# Patient Record
Sex: Female | Born: 1955 | Race: White | Hispanic: No | Smoking: Former smoker
Health system: Southern US, Community
[De-identification: ages and names within clinical notes are randomized; demographics above are authoritative.]

## PROBLEM LIST (undated history)

## (undated) ENCOUNTER — Emergency Department (HOSPITAL_COMMUNITY)

## (undated) DIAGNOSIS — H8109 Meniere's disease, unspecified ear: Secondary | ICD-10-CM

## (undated) DIAGNOSIS — I10 Essential (primary) hypertension: Secondary | ICD-10-CM

## (undated) DIAGNOSIS — J449 Chronic obstructive pulmonary disease, unspecified: Secondary | ICD-10-CM

## (undated) DIAGNOSIS — R079 Chest pain, unspecified: Secondary | ICD-10-CM

## (undated) DIAGNOSIS — M1990 Unspecified osteoarthritis, unspecified site: Secondary | ICD-10-CM

## (undated) DIAGNOSIS — I272 Pulmonary hypertension, unspecified: Secondary | ICD-10-CM

## (undated) DIAGNOSIS — Z87891 Personal history of nicotine dependence: Secondary | ICD-10-CM

## (undated) HISTORY — PX: ABDOMINAL HYSTERECTOMY: SHX81

## (undated) HISTORY — PX: TONSILLECTOMY: SUR1361

---
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
## 2010-09-18 ENCOUNTER — Encounter: Payer: Self-pay | Admitting: Internal Medicine

## 2014-05-31 ENCOUNTER — Encounter (HOSPITAL_COMMUNITY): Payer: Self-pay | Admitting: Emergency Medicine

## 2014-05-31 ENCOUNTER — Emergency Department (HOSPITAL_COMMUNITY)
Admission: EM | Admit: 2014-05-31 | Discharge: 2014-05-31 | Disposition: A | Discharge status: Home / Self Care | Payer: Self-pay | Attending: Emergency Medicine | Admitting: Emergency Medicine

## 2014-05-31 DIAGNOSIS — Z8739 Personal history of other diseases of the musculoskeletal system and connective tissue: Secondary | ICD-10-CM | POA: Insufficient documentation

## 2014-05-31 DIAGNOSIS — H6092 Unspecified otitis externa, left ear: Secondary | ICD-10-CM | POA: Insufficient documentation

## 2014-05-31 DIAGNOSIS — Z792 Long term (current) use of antibiotics: Secondary | ICD-10-CM | POA: Insufficient documentation

## 2014-05-31 DIAGNOSIS — Z880 Allergy status to penicillin: Secondary | ICD-10-CM | POA: Insufficient documentation

## 2014-05-31 DIAGNOSIS — Z720 Tobacco use: Secondary | ICD-10-CM | POA: Insufficient documentation

## 2014-05-31 DIAGNOSIS — J449 Chronic obstructive pulmonary disease, unspecified: Secondary | ICD-10-CM | POA: Insufficient documentation

## 2014-05-31 HISTORY — DX: Pulmonary hypertension, unspecified: I27.20

## 2014-05-31 HISTORY — DX: Unspecified osteoarthritis, unspecified site: M19.90

## 2014-05-31 HISTORY — DX: Chronic obstructive pulmonary disease, unspecified: J44.9

## 2014-05-31 HISTORY — DX: Meniere's disease, unspecified ear: H81.09

## 2014-05-31 MED ORDER — CIPROFLOXACIN-DEXAMETHASONE 0.3-0.1 % OT SUSP: 4.0000 [drp] | Freq: Two times a day (BID) | OTIC | Status: DC

## 2014-05-31 NOTE — ED Notes (Addendum)
She c/o L ear pain and drainage this week and states she can feel a knot under her ear.

## 2014-05-31 NOTE — ED Provider Notes (Signed)
CSN: [id number]     Arrival date & time 05/31/14  1213 History  This chart was scribed for non-physician practitioner, Michele Mcalpine, PA-C,working with Richarda Blade, MD, by Marlowe Kays, ED Scribe. This patient was seen in room TR05C/TR05C and the patient's care was started at 1:05 PM.  Chief Complaint  Patient presents with  . Otalgia   Patient is a 58 y.o. female presenting with ear pain. The history is provided by the patient. No language interpreter was used.  Otalgia Associated symptoms: ear discharge   Associated symptoms: no fever and no hearing loss    HPI Comments:  Amber Sexton is a 58 y.o. female with PMH of HTN, COPD and Meniere Disease who presents to the Emergency Department complaining of left ear pain and drainage for the past 3-4 days. She states she has an associated "knot" below her left ear as well. She states she normally wears a hearing aid which she has in place right now. She has not done anything for her symptoms. She denies fever, chills or increased hearing loss.  Past Medical History  Diagnosis Date  . Meniere disease   . COPD (chronic obstructive pulmonary disease)   . Arthritis   . Pulmonary hypertension    History reviewed. No pertinent past surgical history. History reviewed. No pertinent family history. History  Substance Use Topics  . Smoking status: Current Every Day Smoker    Types: Cigarettes  . Smokeless tobacco: Not on file  . Alcohol Use: No   OB History   Grav Para Term Preterm Abortions TAB SAB Ect Mult Living                 Review of Systems  Constitutional: Negative for fever and chills.  HENT: Positive for ear discharge and ear pain. Negative for hearing loss.   All other systems reviewed and are negative.   Allergies  Ampicillin  Home Medications   Prior to Admission medications   Medication Sig Start Date End Date Taking? Authorizing Provider  ciprofloxacin-dexamethasone (CIPRODEX) otic suspension Place 4 drops  into the left ear 2 (two) times daily. x5 days 05/31/14   Illene Labrador, PA-C   Triage Vitals: BP 124/107  Pulse 88  Temp(Src) 97.8 F (36.6 C) (Oral)  Resp 20  SpO2 98% Physical Exam  Nursing note and vitals reviewed. Constitutional: She is oriented to person, place, and time. She appears well-developed and well-nourished. No distress.  HENT:  Head: Normocephalic and atraumatic.  Left Ear: There is tenderness. No drainage. No decreased hearing is noted.  Mouth/Throat: Oropharynx is clear and moist.  Left ear canal inflamed and erythematous. No drainage. Small amount of cerumen noted.  Eyes: Conjunctivae and EOM are normal.  Neck: Normal range of motion. Neck supple.  Cardiovascular: Normal rate, regular rhythm and normal heart sounds.   Pulmonary/Chest: Effort normal and breath sounds normal. No respiratory distress.  Musculoskeletal: Normal range of motion. She exhibits no edema.  Neurological: She is alert and oriented to person, place, and time. No sensory deficit.  Skin: Skin is warm and dry.  Psychiatric: She has a normal mood and affect. Her behavior is normal.    ED Course  Procedures (including critical care time) DIAGNOSTIC STUDIES: Oxygen Saturation is 98% on RA, normal by my interpretation.   COORDINATION OF CARE: 1:09 PM- Will prescribe Ciprodex otic drops and irrigate left ear. Advised pt to leave hearing aid out until symptoms resolved. Pt verbalizes understanding and agrees to plan.  Medications -  No data to display  Labs Review Labs Reviewed - No data to display  Imaging Review No results found.   EKG Interpretation None      MDM   Final diagnoses:  Left otitis externa   Patient well-appearing in no apparent distress. Afebrile, hypertensive, vitals otherwise stable. Left otitis externa noted. Some cerumen is present, ear was irrigated. TM normal. Treat with Ciprodex ear drops. Advised her to not place her hearing aid until infection has subsided  and followup with PCP. Stable for discharge. Return precautions given. Patient states understanding of treatment care plan and is agreeable.  I personally performed the services described in this documentation, which was scribed in my presence. The recorded information has been reviewed and is accurate.    Illene Labrador, PA-C 05/31/14 1326

## 2014-05-31 NOTE — ED Provider Notes (Signed)
Medical screening examination/treatment/procedure(s) were performed by non-physician practitioner and as supervising physician I was immediately available for consultation/collaboration.  Richarda Blade, MD 05/31/14 [id number]

## 2014-05-31 NOTE — Discharge Instructions (Signed)
Apply antibiotic ear drops into your ear as directed. Follow up with your PCP. Do not use the hearing aid until infection has resolved.  Otitis Externa Otitis externa is a bacterial or fungal infection of the outer ear canal. This is the area from the eardrum to the outside of the ear. Otitis externa is sometimes called "swimmer's ear." CAUSES  Possible causes of infection include:  Swimming in dirty water.  Moisture remaining in the ear after swimming or bathing.  Mild injury (trauma) to the ear.  Objects stuck in the ear (foreign body).  Cuts or scrapes (abrasions) on the outside of the ear. SIGNS AND SYMPTOMS  The first symptom of infection is often itching in the ear canal. Later signs and symptoms may include swelling and redness of the ear canal, ear pain, and yellowish-white fluid (pus) coming from the ear. The ear pain may be worse when pulling on the earlobe. DIAGNOSIS  Your health care provider will perform a physical exam. A sample of fluid may be taken from the ear and examined for bacteria or fungi. TREATMENT  Antibiotic ear drops are often given for 10 to 14 days. Treatment may also include pain medicine or corticosteroids to reduce itching and swelling. HOME CARE INSTRUCTIONS   Apply antibiotic ear drops to the ear canal as prescribed by your health care provider.  Take medicines only as directed by your health care provider.  If you have diabetes, follow any additional treatment instructions from your health care provider.  Keep all follow-up visits as directed by your health care provider. PREVENTION   Keep your ear dry. Use the corner of a towel to absorb water out of the ear canal after swimming or bathing.  Avoid scratching or putting objects inside your ear. This can damage the ear canal or remove the protective wax that lines the canal. This makes it easier for bacteria and fungi to grow.  Avoid swimming in lakes, polluted water, or poorly chlorinated  pools.  You may use ear drops made of rubbing alcohol and vinegar after swimming. Combine equal parts of white vinegar and alcohol in a bottle. Put 3 or 4 drops into each ear after swimming. SEEK MEDICAL CARE IF:   You have a fever.  Your ear is still red, swollen, painful, or draining pus after 3 days.  Your redness, swelling, or pain gets worse.  You have a severe headache.  You have redness, swelling, pain, or tenderness in the area behind your ear. MAKE SURE YOU:   Understand these instructions.  Will watch your condition.  Will get help right away if you are not doing well or get worse. Document Released: 08/14/2005 Document Revised: 12/29/2013 Document Reviewed: 08/31/2011 [facility] Patient Information 2015 [place], [place]. This information is not intended to replace advice given to you by your health care provider. Make sure you discuss any questions you have with your health care provider.

## 2014-05-31 NOTE — ED Notes (Signed)
Declined W/C at D/C and was escorted to lobby by RN. 

## 2015-07-21 DIAGNOSIS — I10 Essential (primary) hypertension: Secondary | ICD-10-CM

## 2015-07-21 DIAGNOSIS — Z87891 Personal history of nicotine dependence: Secondary | ICD-10-CM | POA: Diagnosis not present

## 2015-07-21 DIAGNOSIS — R079 Chest pain, unspecified: Secondary | ICD-10-CM

## 2015-07-21 HISTORY — DX: Essential (primary) hypertension: I10

## 2015-07-21 HISTORY — DX: Chest pain, unspecified: R07.9

## 2015-07-21 HISTORY — DX: Personal history of nicotine dependence: Z87.891

## 2015-08-05 DIAGNOSIS — R079 Chest pain, unspecified: Secondary | ICD-10-CM | POA: Diagnosis not present

## 2015-08-05 DIAGNOSIS — Z87891 Personal history of nicotine dependence: Secondary | ICD-10-CM | POA: Diagnosis not present

## 2015-08-05 DIAGNOSIS — I10 Essential (primary) hypertension: Secondary | ICD-10-CM | POA: Diagnosis not present

## 2015-10-05 DIAGNOSIS — F3181 Bipolar II disorder: Secondary | ICD-10-CM | POA: Diagnosis not present

## 2015-10-05 DIAGNOSIS — F4310 Post-traumatic stress disorder, unspecified: Secondary | ICD-10-CM | POA: Diagnosis not present

## 2015-10-08 DIAGNOSIS — F4310 Post-traumatic stress disorder, unspecified: Secondary | ICD-10-CM | POA: Diagnosis not present

## 2015-10-08 DIAGNOSIS — F3181 Bipolar II disorder: Secondary | ICD-10-CM | POA: Diagnosis not present

## 2015-10-19 DIAGNOSIS — F3181 Bipolar II disorder: Secondary | ICD-10-CM | POA: Diagnosis not present

## 2015-10-19 DIAGNOSIS — F4310 Post-traumatic stress disorder, unspecified: Secondary | ICD-10-CM | POA: Diagnosis not present

## 2015-11-02 DIAGNOSIS — F4310 Post-traumatic stress disorder, unspecified: Secondary | ICD-10-CM | POA: Diagnosis not present

## 2015-11-02 DIAGNOSIS — F3181 Bipolar II disorder: Secondary | ICD-10-CM | POA: Diagnosis not present

## 2015-11-16 DIAGNOSIS — F4310 Post-traumatic stress disorder, unspecified: Secondary | ICD-10-CM | POA: Diagnosis not present

## 2015-11-16 DIAGNOSIS — F3181 Bipolar II disorder: Secondary | ICD-10-CM | POA: Diagnosis not present

## 2015-12-28 DIAGNOSIS — F4310 Post-traumatic stress disorder, unspecified: Secondary | ICD-10-CM | POA: Diagnosis not present

## 2015-12-28 DIAGNOSIS — F3181 Bipolar II disorder: Secondary | ICD-10-CM | POA: Diagnosis not present

## 2016-01-11 DIAGNOSIS — F4310 Post-traumatic stress disorder, unspecified: Secondary | ICD-10-CM | POA: Diagnosis not present

## 2016-01-11 DIAGNOSIS — F3181 Bipolar II disorder: Secondary | ICD-10-CM | POA: Diagnosis not present

## 2016-02-01 DIAGNOSIS — F3181 Bipolar II disorder: Secondary | ICD-10-CM | POA: Diagnosis not present

## 2016-02-01 DIAGNOSIS — F4310 Post-traumatic stress disorder, unspecified: Secondary | ICD-10-CM | POA: Diagnosis not present

## 2016-03-07 DIAGNOSIS — E669 Obesity, unspecified: Secondary | ICD-10-CM | POA: Diagnosis not present

## 2016-03-07 DIAGNOSIS — F329 Major depressive disorder, single episode, unspecified: Secondary | ICD-10-CM | POA: Diagnosis not present

## 2016-03-07 DIAGNOSIS — J449 Chronic obstructive pulmonary disease, unspecified: Secondary | ICD-10-CM | POA: Diagnosis not present

## 2016-03-07 DIAGNOSIS — H8109 Meniere's disease, unspecified ear: Secondary | ICD-10-CM | POA: Diagnosis not present

## 2016-03-07 DIAGNOSIS — Z6833 Body mass index (BMI) 33.0-33.9, adult: Secondary | ICD-10-CM | POA: Diagnosis not present

## 2016-03-20 DIAGNOSIS — H40023 Open angle with borderline findings, high risk, bilateral: Secondary | ICD-10-CM | POA: Diagnosis not present

## 2016-03-21 DIAGNOSIS — H40023 Open angle with borderline findings, high risk, bilateral: Secondary | ICD-10-CM | POA: Diagnosis not present

## 2016-04-05 DIAGNOSIS — Z6833 Body mass index (BMI) 33.0-33.9, adult: Secondary | ICD-10-CM | POA: Diagnosis not present

## 2016-04-05 DIAGNOSIS — E669 Obesity, unspecified: Secondary | ICD-10-CM | POA: Diagnosis not present

## 2016-04-05 DIAGNOSIS — F418 Other specified anxiety disorders: Secondary | ICD-10-CM | POA: Diagnosis not present

## 2016-04-17 DIAGNOSIS — F4310 Post-traumatic stress disorder, unspecified: Secondary | ICD-10-CM | POA: Diagnosis not present

## 2016-04-17 DIAGNOSIS — F3189 Other bipolar disorder: Secondary | ICD-10-CM | POA: Diagnosis not present

## 2016-04-26 DIAGNOSIS — M549 Dorsalgia, unspecified: Secondary | ICD-10-CM | POA: Diagnosis not present

## 2016-04-26 DIAGNOSIS — F418 Other specified anxiety disorders: Secondary | ICD-10-CM | POA: Diagnosis not present

## 2016-04-26 DIAGNOSIS — E669 Obesity, unspecified: Secondary | ICD-10-CM | POA: Diagnosis not present

## 2016-04-26 DIAGNOSIS — H8109 Meniere's disease, unspecified ear: Secondary | ICD-10-CM | POA: Diagnosis not present

## 2016-04-26 DIAGNOSIS — Z1389 Encounter for screening for other disorder: Secondary | ICD-10-CM | POA: Diagnosis not present

## 2016-04-26 DIAGNOSIS — F329 Major depressive disorder, single episode, unspecified: Secondary | ICD-10-CM | POA: Diagnosis not present

## 2016-04-26 DIAGNOSIS — Z0000 Encounter for general adult medical examination without abnormal findings: Secondary | ICD-10-CM | POA: Diagnosis not present

## 2016-05-03 DIAGNOSIS — H60312 Diffuse otitis externa, left ear: Secondary | ICD-10-CM | POA: Diagnosis not present

## 2016-05-03 DIAGNOSIS — H906 Mixed conductive and sensorineural hearing loss, bilateral: Secondary | ICD-10-CM | POA: Diagnosis not present

## 2016-05-03 DIAGNOSIS — H90A32 Mixed conductive and sensorineural hearing loss, unilateral, left ear with restricted hearing on the contralateral side: Secondary | ICD-10-CM | POA: Diagnosis not present

## 2016-05-10 DIAGNOSIS — H906 Mixed conductive and sensorineural hearing loss, bilateral: Secondary | ICD-10-CM | POA: Diagnosis not present

## 2016-05-10 DIAGNOSIS — H60312 Diffuse otitis externa, left ear: Secondary | ICD-10-CM | POA: Diagnosis not present

## 2016-05-16 DIAGNOSIS — J301 Allergic rhinitis due to pollen: Secondary | ICD-10-CM | POA: Diagnosis not present

## 2016-05-16 DIAGNOSIS — J449 Chronic obstructive pulmonary disease, unspecified: Secondary | ICD-10-CM | POA: Diagnosis not present

## 2016-05-16 DIAGNOSIS — R5383 Other fatigue: Secondary | ICD-10-CM | POA: Diagnosis not present

## 2016-05-16 DIAGNOSIS — E559 Vitamin D deficiency, unspecified: Secondary | ICD-10-CM | POA: Diagnosis not present

## 2016-05-16 DIAGNOSIS — G4733 Obstructive sleep apnea (adult) (pediatric): Secondary | ICD-10-CM | POA: Diagnosis not present

## 2016-05-22 DIAGNOSIS — F3181 Bipolar II disorder: Secondary | ICD-10-CM | POA: Diagnosis not present

## 2016-05-22 DIAGNOSIS — F4310 Post-traumatic stress disorder, unspecified: Secondary | ICD-10-CM | POA: Diagnosis not present

## 2016-06-01 DIAGNOSIS — G4733 Obstructive sleep apnea (adult) (pediatric): Secondary | ICD-10-CM | POA: Diagnosis not present

## 2016-06-06 DIAGNOSIS — H25813 Combined forms of age-related cataract, bilateral: Secondary | ICD-10-CM | POA: Diagnosis not present

## 2016-06-06 DIAGNOSIS — H353130 Nonexudative age-related macular degeneration, bilateral, stage unspecified: Secondary | ICD-10-CM | POA: Diagnosis not present

## 2016-06-06 DIAGNOSIS — H401131 Primary open-angle glaucoma, bilateral, mild stage: Secondary | ICD-10-CM | POA: Diagnosis not present

## 2016-06-12 DIAGNOSIS — J449 Chronic obstructive pulmonary disease, unspecified: Secondary | ICD-10-CM | POA: Diagnosis not present

## 2016-06-12 DIAGNOSIS — G4733 Obstructive sleep apnea (adult) (pediatric): Secondary | ICD-10-CM | POA: Diagnosis not present

## 2016-06-12 DIAGNOSIS — R5383 Other fatigue: Secondary | ICD-10-CM | POA: Diagnosis not present

## 2016-06-12 DIAGNOSIS — J301 Allergic rhinitis due to pollen: Secondary | ICD-10-CM | POA: Diagnosis not present

## 2016-06-14 DIAGNOSIS — G4733 Obstructive sleep apnea (adult) (pediatric): Secondary | ICD-10-CM | POA: Diagnosis not present

## 2016-06-19 DIAGNOSIS — H2512 Age-related nuclear cataract, left eye: Secondary | ICD-10-CM | POA: Diagnosis not present

## 2016-06-19 DIAGNOSIS — H25812 Combined forms of age-related cataract, left eye: Secondary | ICD-10-CM | POA: Diagnosis not present

## 2016-07-03 DIAGNOSIS — H25811 Combined forms of age-related cataract, right eye: Secondary | ICD-10-CM | POA: Diagnosis not present

## 2016-07-03 DIAGNOSIS — H2511 Age-related nuclear cataract, right eye: Secondary | ICD-10-CM | POA: Diagnosis not present

## 2016-07-05 DIAGNOSIS — J301 Allergic rhinitis due to pollen: Secondary | ICD-10-CM | POA: Diagnosis not present

## 2016-07-05 DIAGNOSIS — G4733 Obstructive sleep apnea (adult) (pediatric): Secondary | ICD-10-CM | POA: Diagnosis not present

## 2016-07-05 DIAGNOSIS — R5383 Other fatigue: Secondary | ICD-10-CM | POA: Diagnosis not present

## 2016-07-05 DIAGNOSIS — J449 Chronic obstructive pulmonary disease, unspecified: Secondary | ICD-10-CM | POA: Diagnosis not present

## 2016-07-15 DIAGNOSIS — R109 Unspecified abdominal pain: Secondary | ICD-10-CM | POA: Diagnosis not present

## 2016-07-15 DIAGNOSIS — R079 Chest pain, unspecified: Secondary | ICD-10-CM | POA: Diagnosis not present

## 2016-07-18 DIAGNOSIS — F4310 Post-traumatic stress disorder, unspecified: Secondary | ICD-10-CM | POA: Diagnosis not present

## 2016-07-18 DIAGNOSIS — F3181 Bipolar II disorder: Secondary | ICD-10-CM | POA: Diagnosis not present

## 2016-07-19 DIAGNOSIS — D124 Benign neoplasm of descending colon: Secondary | ICD-10-CM | POA: Diagnosis not present

## 2016-07-19 DIAGNOSIS — K5730 Diverticulosis of large intestine without perforation or abscess without bleeding: Secondary | ICD-10-CM | POA: Diagnosis not present

## 2016-07-19 DIAGNOSIS — D120 Benign neoplasm of cecum: Secondary | ICD-10-CM | POA: Diagnosis not present

## 2016-07-19 DIAGNOSIS — Z1211 Encounter for screening for malignant neoplasm of colon: Secondary | ICD-10-CM | POA: Diagnosis not present

## 2016-07-19 DIAGNOSIS — K635 Polyp of colon: Secondary | ICD-10-CM | POA: Diagnosis not present

## 2016-07-19 DIAGNOSIS — K640 First degree hemorrhoids: Secondary | ICD-10-CM | POA: Diagnosis not present

## 2016-08-15 DIAGNOSIS — J301 Allergic rhinitis due to pollen: Secondary | ICD-10-CM | POA: Diagnosis not present

## 2016-08-15 DIAGNOSIS — R5383 Other fatigue: Secondary | ICD-10-CM | POA: Diagnosis not present

## 2016-08-15 DIAGNOSIS — J449 Chronic obstructive pulmonary disease, unspecified: Secondary | ICD-10-CM | POA: Diagnosis not present

## 2016-08-15 DIAGNOSIS — G4733 Obstructive sleep apnea (adult) (pediatric): Secondary | ICD-10-CM | POA: Diagnosis not present

## 2016-08-16 DIAGNOSIS — G4733 Obstructive sleep apnea (adult) (pediatric): Secondary | ICD-10-CM | POA: Diagnosis not present

## 2016-09-11 DIAGNOSIS — R600 Localized edema: Secondary | ICD-10-CM | POA: Diagnosis not present

## 2016-09-11 DIAGNOSIS — F329 Major depressive disorder, single episode, unspecified: Secondary | ICD-10-CM | POA: Diagnosis not present

## 2016-09-11 DIAGNOSIS — E669 Obesity, unspecified: Secondary | ICD-10-CM | POA: Diagnosis not present

## 2016-09-11 DIAGNOSIS — Z6835 Body mass index (BMI) 35.0-35.9, adult: Secondary | ICD-10-CM | POA: Diagnosis not present

## 2016-09-19 DIAGNOSIS — F4310 Post-traumatic stress disorder, unspecified: Secondary | ICD-10-CM | POA: Diagnosis not present

## 2016-09-19 DIAGNOSIS — F3181 Bipolar II disorder: Secondary | ICD-10-CM | POA: Diagnosis not present

## 2016-10-10 DIAGNOSIS — F4310 Post-traumatic stress disorder, unspecified: Secondary | ICD-10-CM | POA: Diagnosis not present

## 2016-10-10 DIAGNOSIS — F3181 Bipolar II disorder: Secondary | ICD-10-CM | POA: Diagnosis not present

## 2016-10-24 DIAGNOSIS — E669 Obesity, unspecified: Secondary | ICD-10-CM | POA: Diagnosis not present

## 2016-10-24 DIAGNOSIS — Z6835 Body mass index (BMI) 35.0-35.9, adult: Secondary | ICD-10-CM | POA: Diagnosis not present

## 2016-10-24 DIAGNOSIS — F418 Other specified anxiety disorders: Secondary | ICD-10-CM | POA: Diagnosis not present

## 2016-10-31 DIAGNOSIS — H401132 Primary open-angle glaucoma, bilateral, moderate stage: Secondary | ICD-10-CM | POA: Diagnosis not present

## 2016-11-10 DIAGNOSIS — G4733 Obstructive sleep apnea (adult) (pediatric): Secondary | ICD-10-CM | POA: Diagnosis not present

## 2016-11-14 DIAGNOSIS — J301 Allergic rhinitis due to pollen: Secondary | ICD-10-CM | POA: Diagnosis not present

## 2016-11-14 DIAGNOSIS — J449 Chronic obstructive pulmonary disease, unspecified: Secondary | ICD-10-CM | POA: Diagnosis not present

## 2016-11-14 DIAGNOSIS — G4733 Obstructive sleep apnea (adult) (pediatric): Secondary | ICD-10-CM | POA: Diagnosis not present

## 2016-11-14 DIAGNOSIS — R5383 Other fatigue: Secondary | ICD-10-CM | POA: Diagnosis not present

## 2016-12-12 DIAGNOSIS — F3181 Bipolar II disorder: Secondary | ICD-10-CM | POA: Diagnosis not present

## 2016-12-12 DIAGNOSIS — F4310 Post-traumatic stress disorder, unspecified: Secondary | ICD-10-CM | POA: Diagnosis not present

## 2017-03-06 DIAGNOSIS — F4310 Post-traumatic stress disorder, unspecified: Secondary | ICD-10-CM | POA: Diagnosis not present

## 2017-03-06 DIAGNOSIS — F3181 Bipolar II disorder: Secondary | ICD-10-CM | POA: Diagnosis not present

## 2017-03-16 DIAGNOSIS — H401132 Primary open-angle glaucoma, bilateral, moderate stage: Secondary | ICD-10-CM | POA: Diagnosis not present

## 2017-03-26 DIAGNOSIS — R600 Localized edema: Secondary | ICD-10-CM | POA: Diagnosis not present

## 2017-03-26 DIAGNOSIS — F329 Major depressive disorder, single episode, unspecified: Secondary | ICD-10-CM | POA: Diagnosis not present

## 2017-03-26 DIAGNOSIS — F419 Anxiety disorder, unspecified: Secondary | ICD-10-CM | POA: Diagnosis not present

## 2017-03-26 DIAGNOSIS — G629 Polyneuropathy, unspecified: Secondary | ICD-10-CM | POA: Diagnosis not present

## 2017-03-26 DIAGNOSIS — Z6836 Body mass index (BMI) 36.0-36.9, adult: Secondary | ICD-10-CM | POA: Diagnosis not present

## 2017-03-26 DIAGNOSIS — M549 Dorsalgia, unspecified: Secondary | ICD-10-CM | POA: Diagnosis not present

## 2017-03-26 DIAGNOSIS — E669 Obesity, unspecified: Secondary | ICD-10-CM | POA: Diagnosis not present

## 2017-05-11 DIAGNOSIS — G4733 Obstructive sleep apnea (adult) (pediatric): Secondary | ICD-10-CM | POA: Diagnosis not present

## 2017-05-22 DIAGNOSIS — F4310 Post-traumatic stress disorder, unspecified: Secondary | ICD-10-CM | POA: Diagnosis not present

## 2017-06-29 DIAGNOSIS — E669 Obesity, unspecified: Secondary | ICD-10-CM | POA: Diagnosis not present

## 2017-06-29 DIAGNOSIS — Z23 Encounter for immunization: Secondary | ICD-10-CM | POA: Diagnosis not present

## 2017-06-29 DIAGNOSIS — F419 Anxiety disorder, unspecified: Secondary | ICD-10-CM | POA: Diagnosis not present

## 2017-06-29 DIAGNOSIS — J449 Chronic obstructive pulmonary disease, unspecified: Secondary | ICD-10-CM | POA: Diagnosis not present

## 2017-06-29 DIAGNOSIS — Z6835 Body mass index (BMI) 35.0-35.9, adult: Secondary | ICD-10-CM | POA: Diagnosis not present

## 2017-06-29 DIAGNOSIS — R600 Localized edema: Secondary | ICD-10-CM | POA: Diagnosis not present

## 2017-06-29 DIAGNOSIS — M549 Dorsalgia, unspecified: Secondary | ICD-10-CM | POA: Diagnosis not present

## 2017-06-29 DIAGNOSIS — Z1331 Encounter for screening for depression: Secondary | ICD-10-CM | POA: Diagnosis not present

## 2017-07-05 DIAGNOSIS — Z961 Presence of intraocular lens: Secondary | ICD-10-CM | POA: Diagnosis not present

## 2017-07-05 DIAGNOSIS — H401132 Primary open-angle glaucoma, bilateral, moderate stage: Secondary | ICD-10-CM | POA: Diagnosis not present

## 2017-08-13 DIAGNOSIS — Z6836 Body mass index (BMI) 36.0-36.9, adult: Secondary | ICD-10-CM | POA: Diagnosis not present

## 2017-08-13 DIAGNOSIS — K219 Gastro-esophageal reflux disease without esophagitis: Secondary | ICD-10-CM | POA: Diagnosis not present

## 2017-08-13 DIAGNOSIS — Z0000 Encounter for general adult medical examination without abnormal findings: Secondary | ICD-10-CM | POA: Diagnosis not present

## 2017-08-13 DIAGNOSIS — E669 Obesity, unspecified: Secondary | ICD-10-CM | POA: Diagnosis not present

## 2017-08-16 DIAGNOSIS — F4310 Post-traumatic stress disorder, unspecified: Secondary | ICD-10-CM | POA: Diagnosis not present

## 2017-09-18 DIAGNOSIS — F4310 Post-traumatic stress disorder, unspecified: Secondary | ICD-10-CM | POA: Diagnosis not present

## 2017-10-01 DIAGNOSIS — F419 Anxiety disorder, unspecified: Secondary | ICD-10-CM | POA: Diagnosis not present

## 2017-10-01 DIAGNOSIS — J449 Chronic obstructive pulmonary disease, unspecified: Secondary | ICD-10-CM | POA: Diagnosis not present

## 2017-10-01 DIAGNOSIS — R600 Localized edema: Secondary | ICD-10-CM | POA: Diagnosis not present

## 2017-10-01 DIAGNOSIS — J441 Chronic obstructive pulmonary disease with (acute) exacerbation: Secondary | ICD-10-CM | POA: Diagnosis not present

## 2017-10-01 DIAGNOSIS — M549 Dorsalgia, unspecified: Secondary | ICD-10-CM | POA: Diagnosis not present

## 2017-10-01 DIAGNOSIS — E669 Obesity, unspecified: Secondary | ICD-10-CM | POA: Diagnosis not present

## 2017-10-01 DIAGNOSIS — Z6837 Body mass index (BMI) 37.0-37.9, adult: Secondary | ICD-10-CM | POA: Diagnosis not present

## 2017-10-09 DIAGNOSIS — M79671 Pain in right foot: Secondary | ICD-10-CM | POA: Diagnosis not present

## 2017-10-09 DIAGNOSIS — Z6836 Body mass index (BMI) 36.0-36.9, adult: Secondary | ICD-10-CM | POA: Diagnosis not present

## 2017-10-09 DIAGNOSIS — J441 Chronic obstructive pulmonary disease with (acute) exacerbation: Secondary | ICD-10-CM | POA: Diagnosis not present

## 2017-10-11 ENCOUNTER — Other Ambulatory Visit: Payer: Self-pay | Admitting: Physician Assistant

## 2017-10-11 ENCOUNTER — Ambulatory Visit
Admission: RE | Admit: 2017-10-11 | Discharge: 2017-10-11 | Disposition: A | Discharge status: Home / Self Care | Payer: Medicare Other | Source: Ambulatory Visit | Attending: Physician Assistant | Admitting: Physician Assistant

## 2017-10-11 DIAGNOSIS — M79672 Pain in left foot: Secondary | ICD-10-CM

## 2017-10-11 DIAGNOSIS — S99922A Unspecified injury of left foot, initial encounter: Secondary | ICD-10-CM | POA: Diagnosis not present

## 2017-10-11 DIAGNOSIS — M7989 Other specified soft tissue disorders: Secondary | ICD-10-CM | POA: Diagnosis not present

## 2017-11-12 DIAGNOSIS — H401132 Primary open-angle glaucoma, bilateral, moderate stage: Secondary | ICD-10-CM | POA: Diagnosis not present

## 2017-11-16 DIAGNOSIS — F330 Major depressive disorder, recurrent, mild: Secondary | ICD-10-CM | POA: Diagnosis not present

## 2017-11-16 DIAGNOSIS — F4310 Post-traumatic stress disorder, unspecified: Secondary | ICD-10-CM | POA: Diagnosis not present

## 2017-12-31 DIAGNOSIS — F419 Anxiety disorder, unspecified: Secondary | ICD-10-CM | POA: Diagnosis not present

## 2017-12-31 DIAGNOSIS — J449 Chronic obstructive pulmonary disease, unspecified: Secondary | ICD-10-CM | POA: Diagnosis not present

## 2017-12-31 DIAGNOSIS — R600 Localized edema: Secondary | ICD-10-CM | POA: Diagnosis not present

## 2017-12-31 DIAGNOSIS — Z6837 Body mass index (BMI) 37.0-37.9, adult: Secondary | ICD-10-CM | POA: Diagnosis not present

## 2017-12-31 DIAGNOSIS — I10 Essential (primary) hypertension: Secondary | ICD-10-CM | POA: Diagnosis not present

## 2017-12-31 DIAGNOSIS — K219 Gastro-esophageal reflux disease without esophagitis: Secondary | ICD-10-CM | POA: Diagnosis not present

## 2017-12-31 DIAGNOSIS — Z136 Encounter for screening for cardiovascular disorders: Secondary | ICD-10-CM | POA: Diagnosis not present

## 2017-12-31 DIAGNOSIS — M25561 Pain in right knee: Secondary | ICD-10-CM | POA: Diagnosis not present

## 2017-12-31 DIAGNOSIS — R69 Illness, unspecified: Secondary | ICD-10-CM | POA: Diagnosis not present

## 2017-12-31 DIAGNOSIS — G629 Polyneuropathy, unspecified: Secondary | ICD-10-CM | POA: Diagnosis not present

## 2018-01-16 DIAGNOSIS — F4310 Post-traumatic stress disorder, unspecified: Secondary | ICD-10-CM | POA: Diagnosis not present

## 2018-01-16 DIAGNOSIS — R69 Illness, unspecified: Secondary | ICD-10-CM | POA: Diagnosis not present

## 2018-01-16 DIAGNOSIS — F330 Major depressive disorder, recurrent, mild: Secondary | ICD-10-CM | POA: Diagnosis not present

## 2018-02-01 DIAGNOSIS — H40013 Open angle with borderline findings, low risk, bilateral: Secondary | ICD-10-CM | POA: Diagnosis not present

## 2018-02-04 DIAGNOSIS — M25561 Pain in right knee: Secondary | ICD-10-CM | POA: Diagnosis not present

## 2018-02-04 DIAGNOSIS — M1711 Unilateral primary osteoarthritis, right knee: Secondary | ICD-10-CM | POA: Diagnosis not present

## 2018-03-11 DIAGNOSIS — H40013 Open angle with borderline findings, low risk, bilateral: Secondary | ICD-10-CM | POA: Diagnosis not present

## 2018-04-08 DIAGNOSIS — H401131 Primary open-angle glaucoma, bilateral, mild stage: Secondary | ICD-10-CM | POA: Diagnosis not present

## 2018-07-09 DIAGNOSIS — H353132 Nonexudative age-related macular degeneration, bilateral, intermediate dry stage: Secondary | ICD-10-CM | POA: Diagnosis not present

## 2018-07-09 DIAGNOSIS — Z961 Presence of intraocular lens: Secondary | ICD-10-CM | POA: Diagnosis not present

## 2018-07-09 DIAGNOSIS — H401132 Primary open-angle glaucoma, bilateral, moderate stage: Secondary | ICD-10-CM | POA: Diagnosis not present

## 2018-11-11 DIAGNOSIS — N959 Unspecified menopausal and perimenopausal disorder: Secondary | ICD-10-CM | POA: Diagnosis not present

## 2018-11-11 DIAGNOSIS — Z9181 History of falling: Secondary | ICD-10-CM | POA: Diagnosis not present

## 2018-11-11 DIAGNOSIS — Z0000 Encounter for general adult medical examination without abnormal findings: Secondary | ICD-10-CM | POA: Diagnosis not present

## 2018-11-11 DIAGNOSIS — Z1339 Encounter for screening examination for other mental health and behavioral disorders: Secondary | ICD-10-CM | POA: Diagnosis not present

## 2018-11-11 DIAGNOSIS — Z1331 Encounter for screening for depression: Secondary | ICD-10-CM | POA: Diagnosis not present

## 2018-11-11 DIAGNOSIS — Z6841 Body Mass Index (BMI) 40.0 and over, adult: Secondary | ICD-10-CM | POA: Diagnosis not present

## 2018-12-16 IMAGING — DX DG FOOT COMPLETE 3+V*L*
3 series · 3 of 3 positions shown · non-contrast
Comparison: None.

CLINICAL DATA: Swelling of the left metatarsals from a fall 1 week
ago.

EXAM:
LEFT FOOT - COMPLETE 3+ VIEW

[dg foot complete left (1 of 3)]
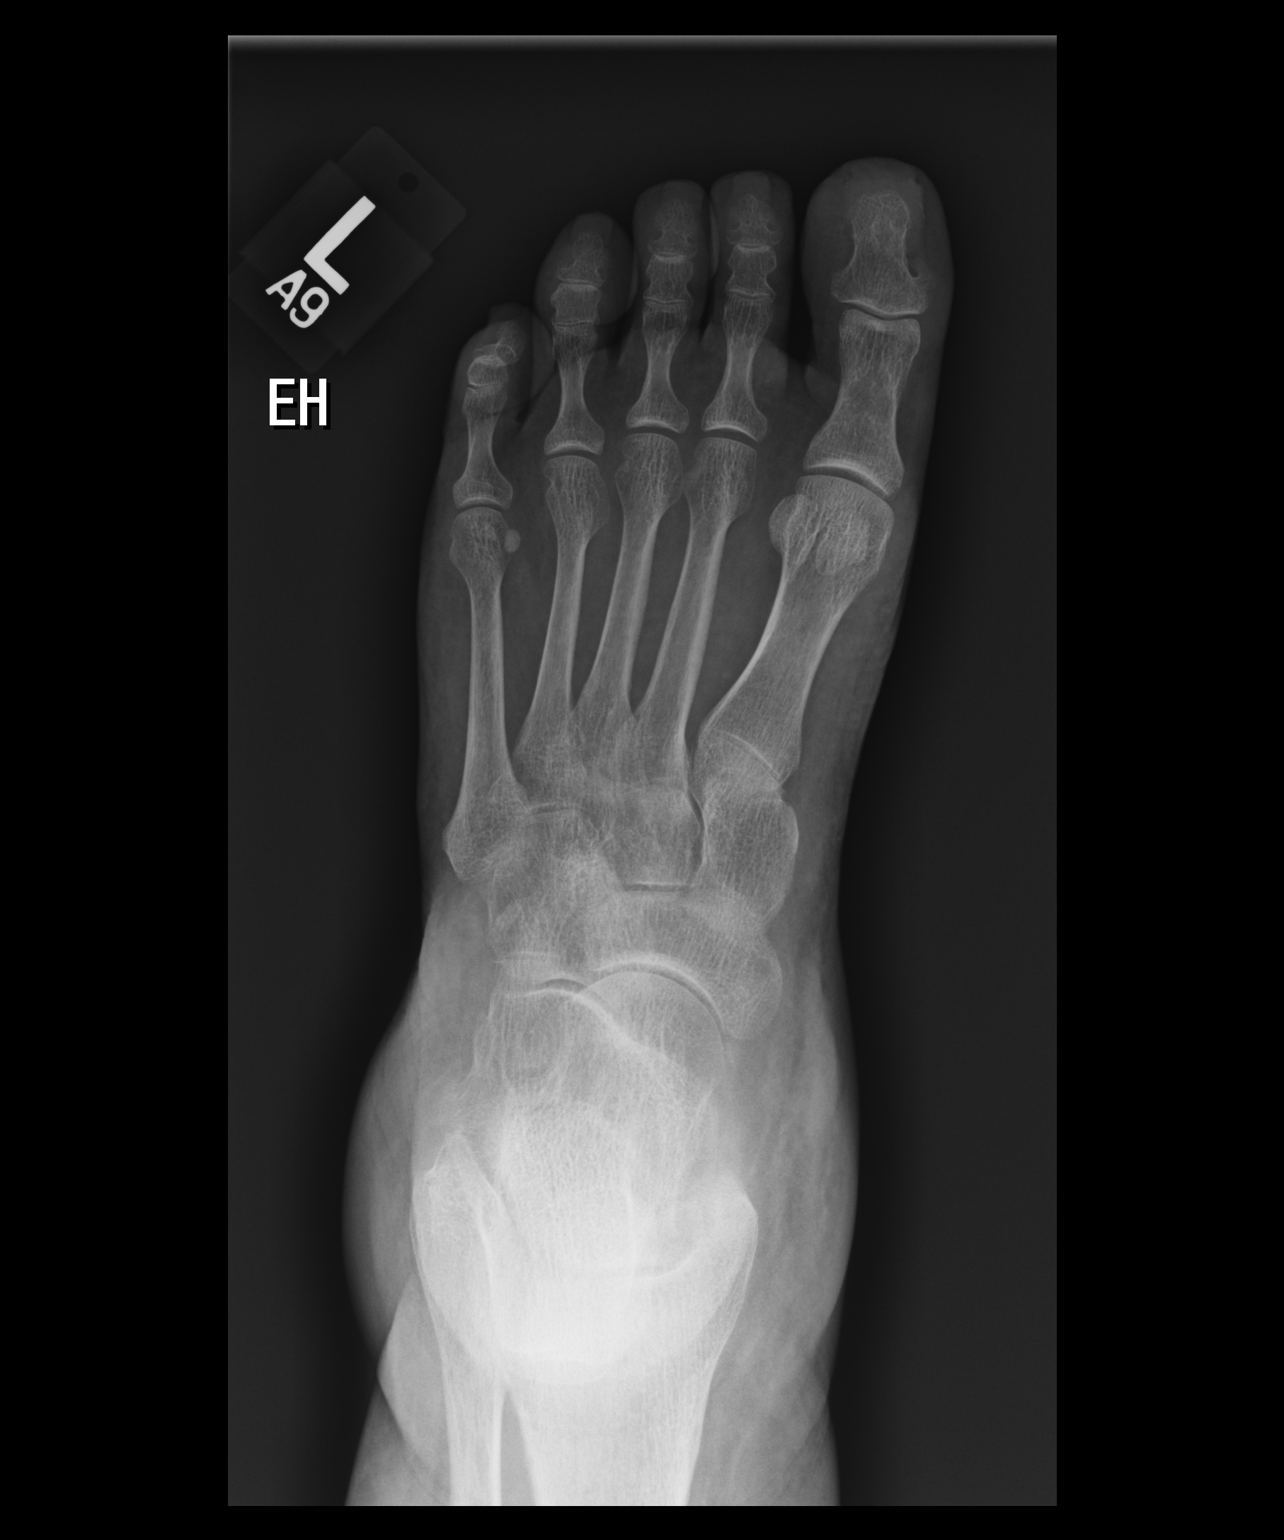

[dg foot complete left (2 of 3)]
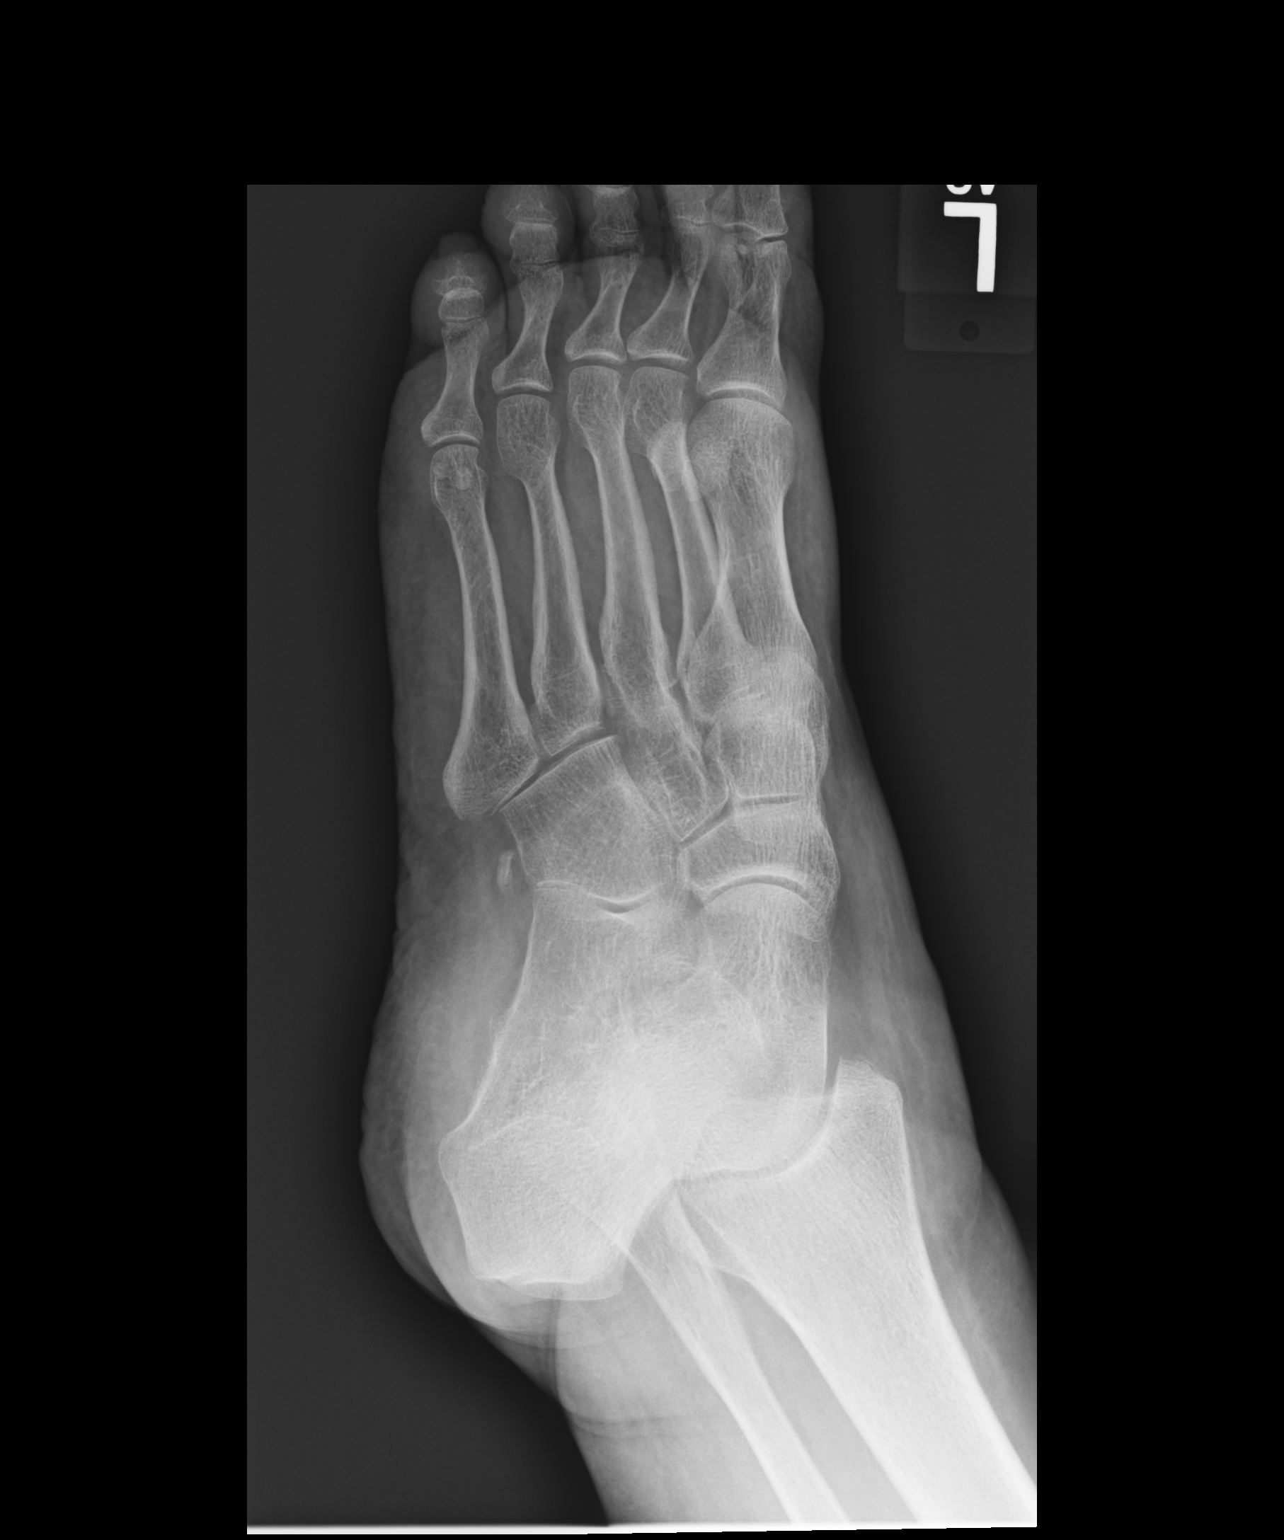

[dg foot complete left (3 of 3)]
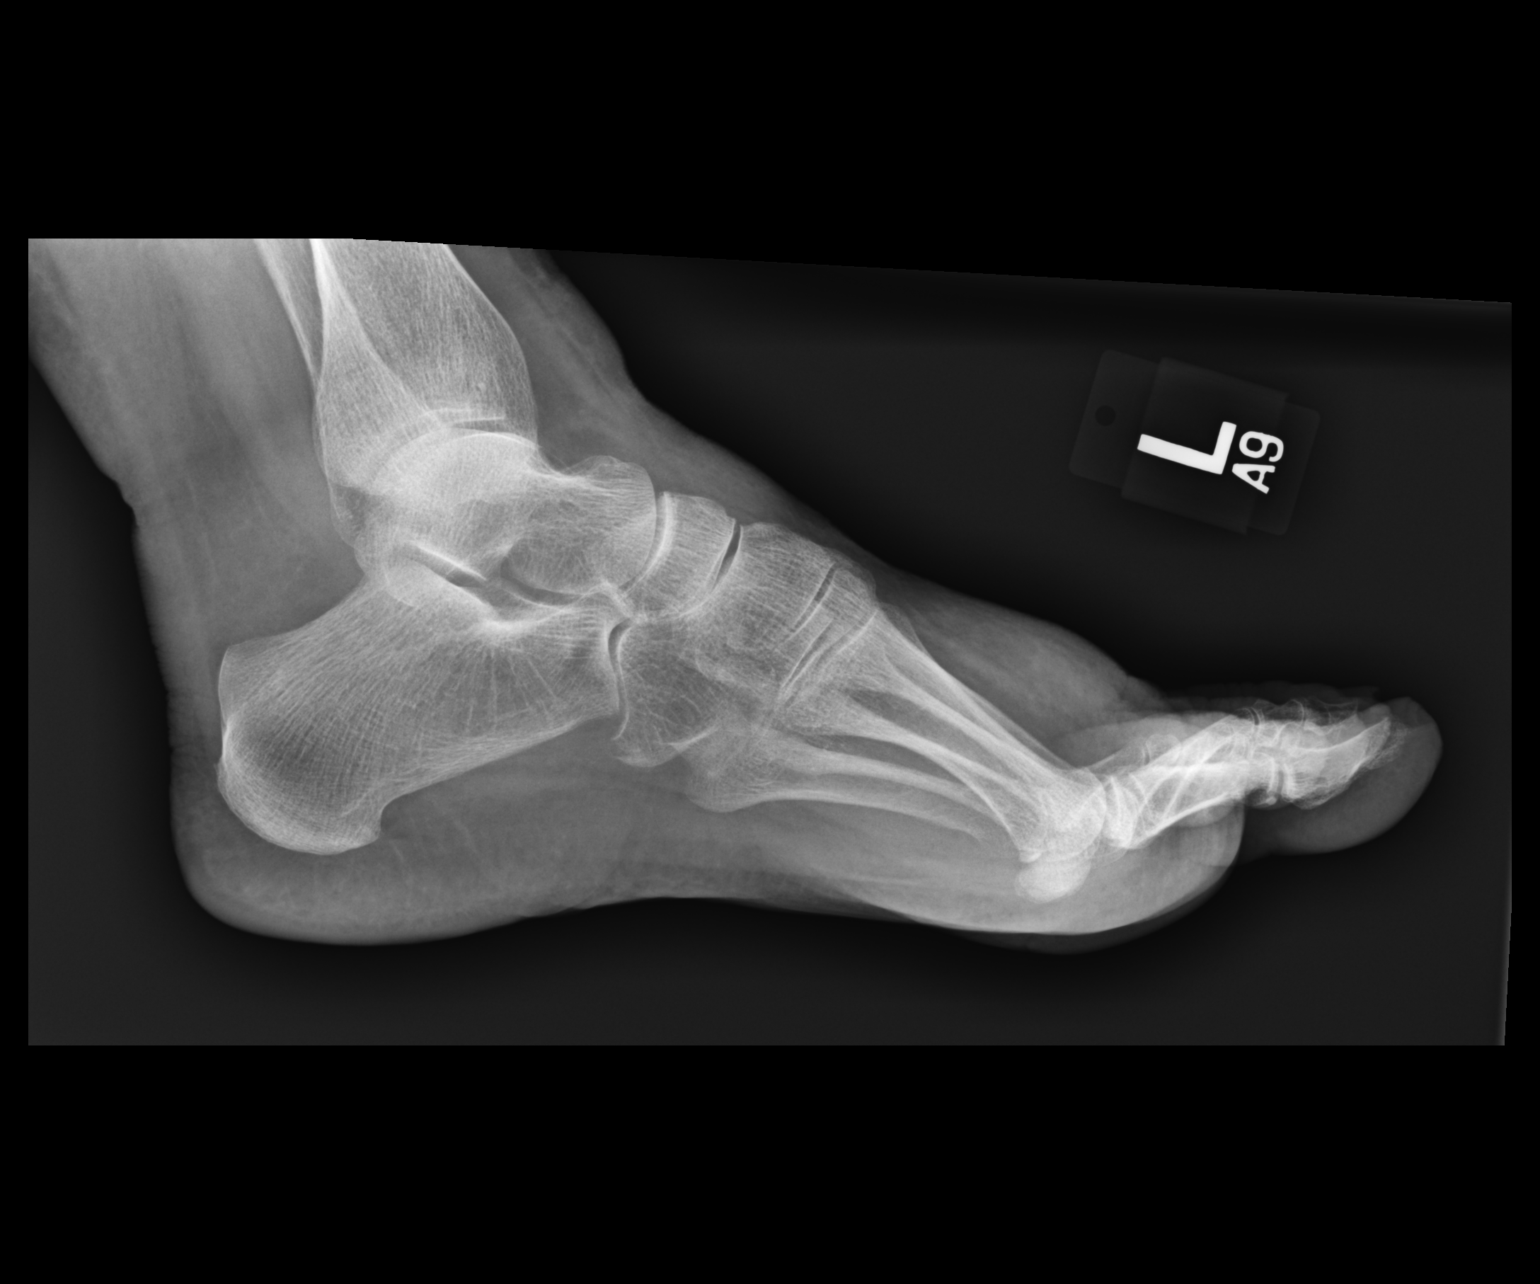

[3 of 3 positions shown; findings below may reference images not displayed]

FINDINGS: There is no evidence of fracture or dislocation. There is no
evidence of arthropathy or other focal bone abnormality. Dorsal soft
tissue swelling.
IMPRESSION: No acute fracture or dislocation identified about the left foot.

## 2019-01-06 DIAGNOSIS — H401132 Primary open-angle glaucoma, bilateral, moderate stage: Secondary | ICD-10-CM | POA: Diagnosis not present

## 2019-04-07 DIAGNOSIS — G629 Polyneuropathy, unspecified: Secondary | ICD-10-CM | POA: Diagnosis not present

## 2019-04-07 DIAGNOSIS — R600 Localized edema: Secondary | ICD-10-CM | POA: Diagnosis not present

## 2019-04-07 DIAGNOSIS — Z6841 Body Mass Index (BMI) 40.0 and over, adult: Secondary | ICD-10-CM | POA: Diagnosis not present

## 2019-04-07 DIAGNOSIS — I10 Essential (primary) hypertension: Secondary | ICD-10-CM | POA: Diagnosis not present

## 2019-04-07 DIAGNOSIS — J449 Chronic obstructive pulmonary disease, unspecified: Secondary | ICD-10-CM | POA: Diagnosis not present

## 2019-04-22 ENCOUNTER — Telehealth: Payer: Self-pay

## 2019-04-22 NOTE — Telephone Encounter (Signed)
Records request from Dr. Chauncy Passy office

## 2019-04-28 ENCOUNTER — Encounter: Payer: Self-pay | Admitting: *Deleted

## 2019-04-28 ENCOUNTER — Other Ambulatory Visit: Payer: Self-pay

## 2019-04-28 ENCOUNTER — Ambulatory Visit (INDEPENDENT_AMBULATORY_CARE_PROVIDER_SITE_OTHER): Payer: Medicare HMO | Admitting: Cardiology

## 2019-04-28 VITALS — BP 144/100 | HR 75 | Ht 65.0 in | Wt 232.0 lb

## 2019-04-28 DIAGNOSIS — R0609 Other forms of dyspnea: Secondary | ICD-10-CM

## 2019-04-28 DIAGNOSIS — I10 Essential (primary) hypertension: Secondary | ICD-10-CM | POA: Diagnosis not present

## 2019-04-28 DIAGNOSIS — Z87891 Personal history of nicotine dependence: Secondary | ICD-10-CM

## 2019-04-28 DIAGNOSIS — R079 Chest pain, unspecified: Secondary | ICD-10-CM

## 2019-04-28 DIAGNOSIS — R0600 Dyspnea, unspecified: Secondary | ICD-10-CM

## 2019-04-28 DIAGNOSIS — G4730 Sleep apnea, unspecified: Secondary | ICD-10-CM

## 2019-04-28 HISTORY — DX: Other forms of dyspnea: R06.09

## 2019-04-28 HISTORY — DX: Dyspnea, unspecified: R06.00

## 2019-04-28 HISTORY — DX: Sleep apnea, unspecified: G47.30

## 2019-04-28 NOTE — Patient Instructions (Signed)
Medication Instructions:   If you need a refill on your cardiac medications before your next appointment, please call your pharmacy.   Lab work:  If you have labs (blood work) drawn today and your tests are completely normal, you will receive your results only by:  [place] (if you have MyChart) OR  A paper copy in the mail If you have any lab test that is abnormal or we need to change your treatment, we will call you to review the results.  Testing/Procedures: You had an EKG performed today.  Your physician has requested that you have an echocardiogram. Echocardiography is a painless test that uses sound waves to create images of your heart. It provides your doctor with information about the size and shape of your heart and how well your hearts chambers and valves are working. This procedure takes approximately one hour. There are no restrictions for this procedure.  Your physician has requested that you have a lexiscan myoview. For further information please visit [email or web address]. Please follow instruction sheet, as given.    Follow-Up: At [facility], you and your health needs are our priority.  As part of our continuing mission to provide you with exceptional heart care, we have created designated Provider Care Teams.  These Care Teams include your primary Cardiologist (physician) and Advanced Practice Providers (APPs -  Physician Assistants and Nurse Practitioners) who all work together to provide you with the care you need, when you need it. You will need a follow up appointment in 6 months.   Any Other Special Instructions Will Be Listed Below  Regadenoson injection What is this medicine? REGADENOSON is used to test the heart for coronary artery disease. It is used in patients who can not exercise for their stress test. This medicine may be used for other purposes; ask your health care provider or pharmacist if you have questions. COMMON BRAND NAME(S):  Lexiscan What should I tell my health care provider before I take this medicine? They need to know if you have any of these conditions:  heart problems  lung or breathing disease, like asthma or COPD  an unusual or allergic reaction to regadenoson, other medicines, foods, dyes, or preservatives  pregnant or trying to get pregnant  breast-feeding How should I use this medicine? This medicine is for injection into a vein. It is given by a health care professional in a hospital or clinic setting. Talk to your pediatrician regarding the use of this medicine in children. Special care may be needed. Overdosage: If you think you have taken too much of this medicine contact a poison control center or emergency room at once. NOTE: This medicine is only for you. Do not share this medicine with others. What if I miss a dose? This does not apply. What may interact with this medicine?  caffeine  dipyridamole  guarana  theophylline This list may not describe all possible interactions. Give your health care provider a list of all the medicines, herbs, non-prescription drugs, or dietary supplements you use. Also tell them if you smoke, drink alcohol, or use illegal drugs. Some items may interact with your medicine. What should I watch for while using this medicine? Your condition will be monitored carefully while you are receiving this medicine. Do not take medicines, foods, or drinks with caffeine (like coffee, tea, or colas) for at least 12 hours before your test. If you do not know if something contains caffeine, ask your health care professional. What side effects may I notice  from receiving this medicine? Side effects that you should report to your doctor or health care professional as soon as possible:  allergic reactions like skin rash, itching or hives, swelling of the face, lips, or tongue  breathing problems  chest pain, tightness or palpitations  severe headache Side effects  that usually do not require medical attention (report to your doctor or health care professional if they continue or are bothersome):  flushing  headache  irritation or pain at site where injected  nausea, vomiting This list may not describe all possible side effects. Call your doctor for medical advice about side effects. You may report side effects to FDA at 1-800-FDA-1088. Where should I keep my medicine? This drug is given in a hospital or clinic and will not be stored at home. NOTE: This sheet is a summary. It may not cover all possible information. If you have questions about this medicine, talk to your doctor, pharmacist, or health care provider.  2020 Elsevier/Gold Standard (2008-04-13 15:08:13)  Cardiac Nuclear Scan A cardiac nuclear scan is a test that is done to check the flow of blood to your heart. It is done when you are resting and when you are exercising. The test looks for problems such as:  Not enough blood reaching a portion of the heart.  The heart muscle not working as it should. You may need this test if:  You have heart disease.  You have had lab results that are not normal.  You have had heart surgery or a balloon procedure to open up blocked arteries (angioplasty).  You have chest pain.  You have shortness of breath. In this test, a special dye (tracer) is put into your bloodstream. The tracer will travel to your heart. A camera will then take pictures of your heart to see how the tracer moves through your heart. This test is usually done at a hospital and takes 2-4 hours. Tell a doctor about:  Any allergies you have.  All medicines you are taking, including vitamins, herbs, eye drops, creams, and over-the-counter medicines.  Any problems you or family members have had with anesthetic medicines.  Any blood disorders you have.  Any surgeries you have had.  Any medical conditions you have.  Whether you are pregnant or may be pregnant. What are  the risks? Generally, this is a safe test. However, problems may occur, such as:  Serious chest pain and heart attack. This is only a risk if the stress portion of the test is done.  Rapid heartbeat.  A feeling of warmth in your chest. This feeling usually does not last long.  Allergic reaction to the tracer. What happens before the test?  Ask your doctor about changing or stopping your normal medicines. This is important.  Follow instructions from your doctor about what you cannot eat or drink.  Remove your jewelry on the day of the test. What happens during the test?  An IV tube will be inserted into one of your veins.  Your doctor will give you a small amount of tracer through the IV tube.  You will wait for 20-40 minutes while the tracer moves through your bloodstream.  Your heart will be monitored with an electrocardiogram (ECG).  You will lie down on an exam table.  Pictures of your heart will be taken for about 15-20 minutes.  You may also have a stress test. For this test, one of these things may be done: ? You will be asked to exercise on  a treadmill or a stationary bike. ? You will be given medicines that will make your heart work harder. This is done if you are unable to exercise.  When blood flow to your heart has peaked, a tracer will again be given through the IV tube.  After 20-40 minutes, you will get back on the exam table. More pictures will be taken of your heart.  Depending on the tracer that is used, more pictures may need to be taken 3-4 hours later.  Your IV tube will be removed when the test is over. The test may vary among doctors and hospitals. What happens after the test?  Ask your doctor: ? Whether you can return to your normal schedule, including diet, activities, and medicines. ? Whether you should drink more fluids. This will help to remove the tracer from your body. Drink enough fluid to keep your pee (urine) pale yellow.  Ask your  doctor, or the department that is doing the test: ? When will my results be ready? ? How will I get my results? Summary  A cardiac nuclear scan is a test that is done to check the flow of blood to your heart.  Tell your doctor whether you are pregnant or may be pregnant.  Before the test, ask your doctor about changing or stopping your normal medicines. This is important.  Ask your doctor whether you can return to your normal activities. You may be asked to drink more fluids. This information is not intended to replace advice given to you by your health care provider. Make sure you discuss any questions you have with your health care provider. Document Released: 01/28/2018 Document Revised: 12/04/2018 Document Reviewed: 01/28/2018 Elsevier Patient Education  [place].  Echocardiogram An echocardiogram is a procedure that uses painless sound waves (ultrasound) to produce an image of the heart. Images from an echocardiogram can provide important information about:  Signs of coronary artery disease (CAD).  Aneurysm detection. An aneurysm is a weak or damaged part of an artery wall that bulges out from the normal force of blood pumping through the body.  Heart size and shape. Changes in the size or shape of the heart can be associated with certain conditions, including heart failure, aneurysm, and CAD.  Heart muscle function.  Heart valve function.  Signs of a past heart attack.  Fluid buildup around the heart.  Thickening of the heart muscle.  A tumor or infectious growth around the heart valves. Tell a health care provider about:  Any allergies you have.  All medicines you are taking, including vitamins, herbs, eye drops, creams, and over-the-counter medicines.  Any blood disorders you have.  Any surgeries you have had.  Any medical conditions you have.  Whether you are pregnant or may be pregnant. What are the risks? Generally, this is a safe procedure.  However, problems may occur, including:  Allergic reaction to dye (contrast) that may be used during the procedure. What happens before the procedure? No specific preparation is needed. You may eat and drink normally. What happens during the procedure?   An IV tube may be inserted into one of your veins.  You may receive contrast through this tube. A contrast is an injection that improves the quality of the pictures from your heart.  A gel will be applied to your chest.  A wand-like tool (transducer) will be moved over your chest. The gel will help to transmit the sound waves from the transducer.  The sound waves will  harmlessly bounce off of your heart to allow the heart images to be captured in real-time motion. The images will be recorded on a computer. The procedure may vary among health care providers and hospitals. What happens after the procedure?  You may return to your normal, everyday life, including diet, activities, and medicines, unless your health care provider tells you not to do that. Summary  An echocardiogram is a procedure that uses painless sound waves (ultrasound) to produce an image of the heart.  Images from an echocardiogram can provide important information about the size and shape of your heart, heart muscle function, heart valve function, and fluid buildup around your heart.  You do not need to do anything to prepare before this procedure. You may eat and drink normally.  After the echocardiogram is completed, you may return to your normal, everyday life, unless your health care provider tells you not to do that. This information is not intended to replace advice given to you by your health care provider. Make sure you discuss any questions you have with your health care provider. Document Released: 08/11/2000 Document Revised: 12/05/2018 Document Reviewed: 09/16/2016 Elsevier Patient Education  2020 [employer].

## 2019-04-28 NOTE — Progress Notes (Signed)
Cardiology Office Note:    Date:  04/28/2019   ID:  Amber Sexton, DOB 09/04/1955, MRN [id number]  PCP:  [facility] [place] [facility]  Cardiologist:  Jenean Lindau, MD   Referring MD: Philmore Pali, NP    ASSESSMENT:    1. Essential hypertension   2. Ex-cigarette smoker   3. Chest pain, unspecified type   4. Dyspnea on exertion   5. Sleep apnea, unspecified type    PLAN:    In order of problems listed above:  1. Chest discomfort: The patient's symptoms are atypical for coronary etiology however in view of risk factors she will undergo Lexiscan sestamibi. 2. Essential hypertension: Blood pressure stable 3. History of pulmonary hypertension: Echocardiogram will be done to assess this. 4. Sleep apnea: Sleep health issues were discussed with the patient.  She will follow-up with this with her primary care physician and sleep specialist 5. Patient will be seen in follow-up appointment in 6 months or earlier if the patient has any concerns.  She knows to go to the nearest emergency room for any concerning symptoms.   Medication Adjustments/Labs and Tests Ordered: Current medicines are reviewed at length with the patient today.  Concerns regarding medicines are outlined above.  No orders of the defined types were placed in this encounter.  No orders of the defined types were placed in this encounter.    History of Present Illness:    Amber Sexton is a 63 y.o. female who is being seen today for the evaluation of dyspnea on exertion at the request of Lam, Rudi Rummage, NP.  Patient is a pleasant 63 year old female.  She has past medical history of pulmonary hypertension, essential hypertension.  She occasionally has chest discomfort and so she is referred here.  This is not related to exertion.  No radiation to the neck or to the arms.  It is stabbing-like nature at times.  At the time of my evaluation, the patient is alert awake oriented and in no distress.  Her daughter  accompanies her for this visit.  Past Medical History:  Diagnosis Date  . Arthritis   . Chest pain 07/21/2015  . COPD (chronic obstructive pulmonary disease) ([place])   . Essential hypertension 07/21/2015  . Ex-cigarette smoker 07/21/2015  . Meniere disease   . Pulmonary hypertension ([place])     Past Surgical History:  Procedure Laterality Date  . ABDOMINAL HYSTERECTOMY    . TONSILLECTOMY      Current Medications: Current Meds  Medication Sig  . albuterol (PROAIR HFA) 108 (90 Base) MCG/ACT inhaler INHALE 2 PUFFS BY MOUTH EVERY 4 TO 6 HOURS AS NEEDED     Allergies:   Ampicillin   Social History   Socioeconomic History  . Marital status: Divorced    Spouse name: Not on file  . Number of children: Not on file  . Years of education: Not on file  . Highest education level: Not on file  Occupational History  . Not on file  Social Needs  . Financial resource strain: Not on file  . Food insecurity    Worry: Not on file    Inability: Not on file  . Transportation needs    Medical: Not on file    Non-medical: Not on file  Tobacco Use  . Smoking status: Current Every Day Smoker    Types: Cigarettes, E-cigarettes  . Smokeless tobacco: Never Used  Substance and Sexual Activity  . Alcohol use: No  . Drug use: No  .  Sexual activity: Not on file  Lifestyle  . Physical activity    Days per week: Not on file    Minutes per session: Not on file  . Stress: Not on file  Relationships  . Social Herbalist on phone: Not on file    Gets together: Not on file    Attends religious service: Not on file    Active member of club or organization: Not on file    Attends meetings of clubs or organizations: Not on file    Relationship status: Not on file  Other Topics Concern  . Not on file  Social History Narrative  . Not on file     Family History: The patient's family history includes Breast cancer in her sister; Colon cancer in her brother; Diabetes in her brother,  brother, and sister; Hypertension in her brother, brother, and sister.  ROS:   Please see the history of present illness.    All other systems reviewed and are negative.  EKGs/Labs/Other Studies Reviewed:    The following studies were reviewed today: I reviewed records from primary care physician's office.  I just received them at 4:40 PM   Recent Labs: No results found for requested labs within last 8760 hours.  Recent Lipid Panel No results found for: CHOL, TRIG, HDL, CHOLHDL, VLDL, LDLCALC, LDLDIRECT  Physical Exam:    VS:  BP (!) 144/100 (BP Location: Right Arm, Patient Position: Sitting, Cuff Size: Large)   Pulse 75   Ht 5\' 5"  (1.651 m)   Wt 232 lb (105.2 kg)   BMI 38.61 kg/m     Wt Readings from Last 3 Encounters:  04/28/19 232 lb (105.2 kg)     GEN: Patient is in no acute distress HEENT: Normal NECK: No JVD; No carotid bruits LYMPHATICS: No lymphadenopathy CARDIAC: S1 S2 regular, 2/6 systolic murmur at the apex. RESPIRATORY:  Clear to auscultation without rales, wheezing or rhonchi  ABDOMEN: Soft, non-tender, non-distended MUSCULOSKELETAL:  No edema; No deformity  SKIN: Warm and dry NEUROLOGIC:  Alert and oriented x 3 PSYCHIATRIC:  Normal affect    Signed, Jenean Lindau, MD  04/28/2019 11:29 AM    [facility]

## 2019-05-21 ENCOUNTER — Telehealth (HOSPITAL_COMMUNITY): Payer: Self-pay | Admitting: *Deleted

## 2019-05-21 ENCOUNTER — Encounter (HOSPITAL_COMMUNITY): Payer: Self-pay | Admitting: *Deleted

## 2019-05-21 NOTE — Telephone Encounter (Signed)
Left message on voicemail in reference to upcoming appointment scheduled for 05/27/19. Phone number given for a call back so details instructions can be given. Amber Sexton

## 2019-05-26 ENCOUNTER — Other Ambulatory Visit: Payer: Self-pay

## 2019-05-26 ENCOUNTER — Ambulatory Visit (INDEPENDENT_AMBULATORY_CARE_PROVIDER_SITE_OTHER): Payer: Medicare HMO

## 2019-05-26 DIAGNOSIS — I10 Essential (primary) hypertension: Secondary | ICD-10-CM

## 2019-05-26 DIAGNOSIS — R0609 Other forms of dyspnea: Secondary | ICD-10-CM | POA: Diagnosis not present

## 2019-05-26 DIAGNOSIS — R079 Chest pain, unspecified: Secondary | ICD-10-CM

## 2019-05-26 NOTE — Progress Notes (Signed)
Complete echocardiogram has been performed.  Jimmy Ethlyn Alto RDCS, RVT 

## 2019-05-27 ENCOUNTER — Ambulatory Visit (INDEPENDENT_AMBULATORY_CARE_PROVIDER_SITE_OTHER): Payer: Medicare HMO

## 2019-05-27 DIAGNOSIS — R079 Chest pain, unspecified: Secondary | ICD-10-CM

## 2019-05-27 DIAGNOSIS — R0609 Other forms of dyspnea: Secondary | ICD-10-CM | POA: Diagnosis not present

## 2019-05-27 LAB — MYOCARDIAL PERFUSION IMAGING
LV dias vol: 71 mL (ref 46–106)
LV sys vol: 25 mL
Peak HR: 76 {beats}/min
Rest HR: 76 {beats}/min
SDS: 4
SRS: 6
SSS: 8
TID: 1.18

## 2019-05-27 MED ORDER — REGADENOSON 0.4 MG/5ML IV SOLN
0.4000 mg | Freq: Once | INTRAVENOUS | Status: AC
  Administered 2019-05-27: 0.4 mg via INTRAVENOUS

## 2019-05-27 MED ORDER — TECHNETIUM TC 99M TETROFOSMIN IV KIT
31.8000 | PACK | Freq: Once | INTRAVENOUS | Status: AC | PRN
  Administered 2019-05-27: 31.8 via INTRAVENOUS

## 2019-05-27 MED ORDER — TECHNETIUM TC 99M TETROFOSMIN IV KIT
10.5000 | PACK | Freq: Once | INTRAVENOUS | Status: AC | PRN
  Administered 2019-05-27: 10.5 via INTRAVENOUS

## 2019-05-28 ENCOUNTER — Telehealth: Payer: Self-pay

## 2019-05-28 NOTE — Telephone Encounter (Signed)
Results from echo and lexi relayed. Patient scheduled for cath discussion on 06/02/19 with Dr. Docia Furl. Copy of echo sent to [facility].

## 2019-05-28 NOTE — Telephone Encounter (Signed)
-----   Message from Rajan R Revankar, MD sent at 05/27/2019 11:38 AM EDT ----- The results of the study is unremarkable. Please inform patient. I will discuss in detail at next appointment. Cc  primary care/referring physician Rajan R Revankar, MD 05/27/2019 11:38 AM 

## 2019-05-29 ENCOUNTER — Telehealth: Payer: Self-pay | Admitting: Cardiology

## 2019-05-29 NOTE — Telephone Encounter (Signed)
Please send nitroglycerin prescription.  If this does not help educate her that she needs to go to the emergency room.

## 2019-05-29 NOTE — Telephone Encounter (Signed)
Patient states that since the stress test she has beenhaving chest pressure ans she is concerned, Please call her.Marland Kitchen

## 2019-06-02 ENCOUNTER — Other Ambulatory Visit: Payer: Self-pay

## 2019-06-02 ENCOUNTER — Ambulatory Visit (INDEPENDENT_AMBULATORY_CARE_PROVIDER_SITE_OTHER): Payer: Medicare HMO | Admitting: Cardiology

## 2019-06-02 ENCOUNTER — Encounter: Payer: Self-pay | Admitting: Cardiology

## 2019-06-02 VITALS — BP 132/86 | HR 83 | Ht 64.0 in | Wt 240.0 lb

## 2019-06-02 DIAGNOSIS — G4730 Sleep apnea, unspecified: Secondary | ICD-10-CM | POA: Diagnosis not present

## 2019-06-02 DIAGNOSIS — I10 Essential (primary) hypertension: Secondary | ICD-10-CM

## 2019-06-02 DIAGNOSIS — R0600 Dyspnea, unspecified: Secondary | ICD-10-CM | POA: Diagnosis not present

## 2019-06-02 DIAGNOSIS — R079 Chest pain, unspecified: Secondary | ICD-10-CM | POA: Diagnosis not present

## 2019-06-02 DIAGNOSIS — I209 Angina pectoris, unspecified: Secondary | ICD-10-CM | POA: Diagnosis not present

## 2019-06-02 DIAGNOSIS — R9439 Abnormal result of other cardiovascular function study: Secondary | ICD-10-CM | POA: Diagnosis not present

## 2019-06-02 DIAGNOSIS — Z87891 Personal history of nicotine dependence: Secondary | ICD-10-CM | POA: Diagnosis not present

## 2019-06-02 DIAGNOSIS — R0609 Other forms of dyspnea: Secondary | ICD-10-CM

## 2019-06-02 HISTORY — DX: Abnormal result of other cardiovascular function study: R94.39

## 2019-06-02 HISTORY — DX: Angina pectoris, unspecified: I20.9

## 2019-06-02 MED ORDER — METOPROLOL TARTRATE 25 MG PO TABS: 25.0000 mg | ORAL_TABLET | Freq: Every day | ORAL | 1 refills | Status: DC

## 2019-06-02 MED ORDER — NITROGLYCERIN 0.4 MG SL SUBL: 0.4000 mg | SUBLINGUAL_TABLET | SUBLINGUAL | 3 refills | Status: DC | PRN

## 2019-06-02 NOTE — Patient Instructions (Addendum)
Medication Instructions:  Your physician has recommended you make the following change in your medication:  START taking nitroglycerin as needed for chest pain.When having chest pain, stop what you are doing and sit down. Take 1 nitro, wait 5 minutes. Still having chest pain, take 1 nitro, wait 5 minutes. Still having chest pain, take 1 nitro, dial 911. Total of 3 nitro in 15 minutes.  START taking aspirin 81 mg (1 tablet) once daily  START taking metoprolol 25 mg ( 1 tablet) once daily  If you need a refill on your cardiac medications before your next appointment, please call your pharmacy.   Lab work: You will have a CBC and BMP drawn today.  If you have labs (blood work) drawn today and your tests are completely normal, you will receive your results only by: Marland Kitchen MyChart Message (if you have MyChart) OR . A paper copy in the mail If you have any lab test that is abnormal or we need to change your treatment, we will call you to review the results.  Testing/Procedures: You had an EKG performed today.  You ARE SCHEDULED for cvd 19 screening at [place] on [address], 06/04/19 at 11:30 AM. You will need to get in the line for PRESCREENING  Your physician has requested that you have a cardiac catheterization. Cardiac catheterization is used to diagnose and/or treat various heart conditions. Doctors may recommend this procedure for a number of different reasons. The most common reason is to evaluate chest pain. Chest pain can be a symptom of coronary artery disease (CAD), and cardiac catheterization can show whether plaque is narrowing or blocking your heart's arteries. This procedure is also used to evaluate the valves, as well as measure the blood flow and oxygen levels in different parts of your heart. For further information please visit [email or web address]. Please follow instruction sheet, as given.     [facility] [address] [place] [address] Dept: [phone] Loc: [id number]  06/02/2019  You are scheduled for a Cardiac Catheterization on Monday, October 12 with Dr. Daneen Schick.  1. Please arrive at the [facility] (Main Entrance A) at [facility]: [address] [place], [place] [address] at 5:30 AM (This time is two hours before your procedure to ensure your preparation). Free valet parking service is available.   Special note: Every effort is made to have your procedure done on time. Please understand that emergencies sometimes delay scheduled procedures.  2. Diet: Do not eat solid foods after midnight.  The patient may have clear liquids until 5am upon the day of the procedure.  3. LabsNONE 4. Medication instructions in preparation for your procedure:   Contrast Allergy: No    On the morning of your procedure, take your Aspirin and any morning medicines NOT listed above.  You may use sips of water.  5. Plan for one night stay--bring personal belongings. 6. Bring a current list of your medications and current insurance cards. 7. You MUST have a responsible person to drive you home. 8. Someone MUST be with you the first 24 hours after you arrive home or your discharge will be delayed. 9. Please wear clothes that are easy to get on and off and wear slip-on shoes.  Thank you for allowing [place] to care for you!   -- [facility]   Follow-Up: At [facility], you and your health needs  are our priority.  As part of our continuing mission to provide you with exceptional heart care, we have created designated Provider Care Teams.  These Care Teams include your primary Cardiologist (physician) and Advanced Practice Providers (APPs -  Physician Assistants and Nurse Practitioners) who all work together to provide you with the care you need, when you need it. You will need a follow up appointment in 1 months.     Any Other Special Instructions Will Be Listed Below   Coronary Angiogram A coronary angiogram is an X-ray procedure that is used to examine the arteries in the heart. In this procedure, a dye (contrast dye) is injected through a long, thin tube (catheter). The catheter is inserted through the groin, wrist, or arm. The dye is injected into each artery, then X-rays are taken to show if there is a blockage in the arteries of the heart. This procedure can also show if you have valve disease or a disease of the aorta, and it can be used to check the overall function of your heart muscle. You may have a coronary angiogram if:  You are having chest pain, or other symptoms of angina, and you are at risk for heart disease.  You have an abnormal electrocardiogram (ECG) or stress test.  You have chest pain and heart failure.  You are having irregular heart rhythms.  You and your health care provider determine that the benefits of the test information outweigh the risks of the procedure. Let your health care provider know about:  Any allergies you have, including allergies to contrast dye.  All medicines you are taking, including vitamins, herbs, eye drops, creams, and over-the-counter medicines.  Any problems you or family members have had with anesthetic medicines.  Any blood disorders you have.  Any surgeries you have had.  History of kidney problems or kidney failure.  Any medical conditions you have.  Whether you are pregnant or may be pregnant. What are the risks? Generally, this is a safe procedure. However, problems may occur, including:  Infection.  Allergic reaction to medicines or dyes that are used.  Bleeding from the access site or other locations.  Kidney injury, especially in people with impaired kidney function.  Stroke (rare).  Heart attack (rare).  Damage to other structures or organs. What happens before the procedure? Staying hydrated Follow instructions  from your health care provider about hydration, which may include:  Up to 2 hours before the procedure - you may continue to drink clear liquids, such as water, clear fruit juice, black coffee, and plain tea. Eating and drinking restrictions Follow instructions from your health care provider about eating and drinking, which may include:  8 hours before the procedure - stop eating heavy meals or foods such as meat, fried foods, or fatty foods.  6 hours before the procedure - stop eating light meals or foods, such as toast or cereal.  2 hours before the procedure - stop drinking clear liquids. General instructions  Ask your health care provider about: ? Changing or stopping your regular medicines. This is especially important if you are taking diabetes medicines or blood thinners. ? Taking medicines such as ibuprofen. These medicines can thin your blood. Do not take these medicines before your procedure if your health care provider instructs you not to, though aspirin may be recommended prior to coronary angiograms.  Plan to have someone take you home from the hospital or clinic.  You may need to have blood tests or X-rays done.  What happens during the procedure?  An IV tube will be inserted into one of your veins.  You will be given one or more of the following: ? A medicine to help you relax (sedative). ? A medicine to numb the area where the catheter will be inserted into an artery (local anesthetic).  To reduce your risk of infection: ? Your health care team will wash or sanitize their hands. ? Your skin will be washed with soap. ? Hair may be removed from the area where the catheter will be inserted.  You will be connected to a continuous ECG monitor.  The catheter will be inserted into an artery. The location may be in your groin, in your wrist, or in the fold of your arm (near your elbow).  A type of X-ray (fluoroscopy) will be used to help guide the catheter to the opening  of the blood vessel that is being examined.  A dye will be injected into the catheter, and X-rays will be taken. The dye will help to show where any narrowing or blockages are located in the heart arteries.  Tell your health care provider if you have any chest pain or trouble breathing during the procedure.  If blockages are found, your health care provider may perform another procedure, such as inserting a coronary stent. The procedure may vary among health care providers and hospitals. What happens after the procedure?  After the procedure, you will need to keep the area still for a few hours, or for as long as told by your health care provider. If the procedure is done through the groin, you will be instructed to not bend and not cross your legs.  The insertion site will be checked frequently.  The pulse in your foot or wrist will be checked frequently.  You may have additional blood tests, X-rays, and a test that records the electrical activity of your heart (ECG).  Do not drive for 24 hours if you were given a sedative. Summary  A coronary angiogram is an X-ray procedure that is used to look into the arteries in the heart.  During the procedure, a dye (contrast dye) is injected through a long, thin tube (catheter). The catheter is inserted through the groin, wrist, or arm.  Tell your health care provider about any allergies you have, including allergies to contrast dye.  After the procedure, you will need to keep the area still for a few hours, or for as long as told by your health care provider. This information is not intended to replace advice given to you by your health care provider. Make sure you discuss any questions you have with your health care provider. Document Released: 02/18/2003 Document Revised: 07/27/2017 Document Reviewed: 05/26/2016 Elsevier Patient Education  2020 [place]. Nitroglycerin sublingual tablets What is this medicine? NITROGLYCERIN (nye troe  GLI ser in) is a type of vasodilator. It relaxes blood vessels, increasing the blood and oxygen supply to your heart. This medicine is used to relieve chest pain caused by angina. It is also used to prevent chest pain before activities like climbing stairs, going outdoors in cold weather, or sexual activity. This medicine may be used for other purposes; ask your health care provider or pharmacist if you have questions. COMMON BRAND NAME(S): Nitroquick, Nitrostat, Nitrotab What should I tell my health care provider before I take this medicine? They need to know if you have any of these conditions:  anemia  head injury, recent stroke, or bleeding in the  brain  liver disease  previous heart attack  an unusual or allergic reaction to nitroglycerin, other medicines, foods, dyes, or preservatives  pregnant or trying to get pregnant  breast-feeding How should I use this medicine? Take this medicine by mouth as needed. At the first sign of an angina attack (chest pain or tightness) place one tablet under your tongue. You can also take this medicine 5 to 10 minutes before an event likely to produce chest pain. Follow the directions on the prescription label. Let the tablet dissolve under the tongue. Do not swallow whole. Replace the dose if you accidentally swallow it. It will help if your mouth is not dry. Saliva around the tablet will help it to dissolve more quickly. Do not eat or drink, smoke or chew tobacco while a tablet is dissolving. If you are not better within 5 minutes after taking ONE dose of nitroglycerin, call 9-1-1 immediately to seek emergency medical care. Do not take more than 3 nitroglycerin tablets over 15 minutes. If you take this medicine often to relieve symptoms of angina, your doctor or health care professional may provide you with different instructions to manage your symptoms. If symptoms do not go away after following these instructions, it is important to call 9-1-1  immediately. Do not take more than 3 nitroglycerin tablets over 15 minutes. Talk to your pediatrician regarding the use of this medicine in children. Special care may be needed. Overdosage: If you think you have taken too much of this medicine contact a poison control center or emergency room at once. NOTE: This medicine is only for you. Do not share this medicine with others. What if I miss a dose? This does not apply. This medicine is only used as needed. What may interact with this medicine? Do not take this medicine with any of the following medications:  certain migraine medicines like ergotamine and dihydroergotamine (DHE)  medicines used to treat erectile dysfunction like sildenafil, tadalafil, and vardenafil  riociguat This medicine may also interact with the following medications:  alteplase  aspirin  heparin  medicines for high blood pressure  medicines for mental depression  other medicines used to treat angina  phenothiazines like chlorpromazine, mesoridazine, prochlorperazine, thioridazine This list may not describe all possible interactions. Give your health care provider a list of all the medicines, herbs, non-prescription drugs, or dietary supplements you use. Also tell them if you smoke, drink alcohol, or use illegal drugs. Some items may interact with your medicine. What should I watch for while using this medicine? Tell your doctor or health care professional if you feel your medicine is no longer working. Keep this medicine with you at all times. Sit or lie down when you take your medicine to prevent falling if you feel dizzy or faint after using it. Try to remain calm. This will help you to feel better faster. If you feel dizzy, take several deep breaths and lie down with your feet propped up, or bend forward with your head resting between your knees. You may get drowsy or dizzy. Do not drive, use machinery, or do anything that needs mental alertness until you  know how this drug affects you. Do not stand or sit up quickly, especially if you are an older patient. This reduces the risk of dizzy or fainting spells. Alcohol can make you more drowsy and dizzy. Avoid alcoholic drinks. Do not treat yourself for coughs, colds, or pain while you are taking this medicine without asking your doctor or health care  professional for advice. Some ingredients may increase your blood pressure. What side effects may I notice from receiving this medicine? Side effects that you should report to your doctor or health care professional as soon as possible:  blurred vision  dry mouth  skin rash  sweating  the feeling of extreme pressure in the head  unusually weak or tired Side effects that usually do not require medical attention (report to your doctor or health care professional if they continue or are bothersome):  flushing of the face or neck  headache  irregular heartbeat, palpitations  nausea, vomiting This list may not describe all possible side effects. Call your doctor for medical advice about side effects. You may report side effects to FDA at 1-800-FDA-1088. Where should I keep my medicine? Keep out of the reach of children. Store at room temperature between 20 and 25 degrees C (68 and 77 degrees F). Store in [facility] [employer]. Protect from light and moisture. Keep tightly closed. Throw away any unused medicine after the expiration date. NOTE: This sheet is a summary. It may not cover all possible information. If you have questions about this medicine, talk to your doctor, pharmacist, or health care provider.  2020 Elsevier/Gold Standard (2013-06-12 17:57:36) Metoprolol extended-release tablets What is this medicine? METOPROLOL (me TOE proe lole) is a beta-blocker. Beta-blockers reduce the workload on the heart and help it to beat more regularly. This medicine is used to treat high blood pressure and to prevent chest pain. It is also used to after a  heart attack and to prevent an additional heart attack from occurring. This medicine may be used for other purposes; ask your health care provider or pharmacist if you have questions. COMMON BRAND NAME(S): toprol, Toprol XL What should I tell my health care provider before I take this medicine? They need to know if you have any of these conditions:  diabetes  heart or vessel disease like slow heart rate, worsening heart failure, heart block, sick sinus syndrome or Raynaud's disease  kidney disease  liver disease  lung or breathing disease, like asthma or emphysema  pheochromocytoma  thyroid disease  an unusual or allergic reaction to metoprolol, other beta-blockers, medicines, foods, dyes, or preservatives  pregnant or trying to get pregnant  breast-feeding How should I use this medicine? Take this medicine by mouth with a glass of water. Follow the directions on the prescription label. Do not crush or chew. Take this medicine with or immediately after meals. Take your doses at regular intervals. Do not take more medicine than directed. Do not stop taking this medicine suddenly. This could lead to serious heart-related effects. Talk to your pediatrician regarding the use of this medicine in children. While this drug may be prescribed for children as young as 6 years for selected conditions, precautions do apply. Overdosage: If you think you have taken too much of this medicine contact a poison control center or emergency room at once. NOTE: This medicine is only for you. Do not share this medicine with others. What if I miss a dose? If you miss a dose, take it as soon as you can. If it is almost time for your next dose, take only that dose. Do not take double or extra doses. What may interact with this medicine? This medicine may interact with the following medications:  certain medicines for blood pressure, heart disease, irregular heart beat  certain medicines for depression,  like monoamine oxidase (MAO) inhibitors, fluoxetine, or paroxetine  clonidine  dobutamine  epinephrine  isoproterenol  reserpine This list may not describe all possible interactions. Give your health care provider a list of all the medicines, herbs, non-prescription drugs, or dietary supplements you use. Also tell them if you smoke, drink alcohol, or use illegal drugs. Some items may interact with your medicine. What should I watch for while using this medicine? Visit your doctor or health care professional for regular check ups. Contact your doctor right away if your symptoms worsen. Check your blood pressure and pulse rate regularly. Ask your health care professional what your blood pressure and pulse rate should be, and when you should contact them. You may get drowsy or dizzy. Do not drive, use machinery, or do anything that needs mental alertness until you know how this medicine affects you. Do not sit or stand up quickly, especially if you are an older patient. This reduces the risk of dizzy or fainting spells. Contact your doctor if these symptoms continue. Alcohol may interfere with the effect of this medicine. Avoid alcoholic drinks. This medicine may increase blood sugar. Ask your healthcare provider if changes in diet or medicines are needed if you have diabetes. What side effects may I notice from receiving this medicine? Side effects that you should report to your doctor or health care professional as soon as possible:  allergic reactions like skin rash, itching or hives  cold or numb hands or feet  depression  difficulty breathing  faint  fever with sore throat  irregular heartbeat, chest pain  rapid weight gain   signs and symptoms of high blood sugar such as being more thirsty or hungry or having to urinate more than normal. You may also feel very tired or have blurry vision.  swollen legs or ankles Side effects that usually do not require medical attention  (report to your doctor or health care professional if they continue or are bothersome):  anxiety or nervousness  change in sex drive or performance  dry skin  headache  nightmares or trouble sleeping  short term memory loss  stomach upset or diarrhea This list may not describe all possible side effects. Call your doctor for medical advice about side effects. You may report side effects to FDA at 1-800-FDA-1088. Where should I keep my medicine? Keep out of the reach of children. Store at room temperature between 15 and 30 degrees C (59 and 86 degrees F). Throw away any unused medicine after the expiration date. NOTE: This sheet is a summary. It may not cover all possible information. If you have questions about this medicine, talk to your doctor, pharmacist, or health care provider.  2020 Elsevier/Gold Standard (2018-06-04 11:09:41)  Aspirin and Your Heart  Aspirin is a medicine that prevents the cells in the blood that are used for clotting, called platelets, from sticking together. Aspirin can be used to help reduce the risk of blood clots, heart attacks, and other heart-related problems. Can I take aspirin? Your health care provider will help you determine whether it is safe and beneficial for you to take aspirin daily. Taking aspirin daily may be helpful if you:  Have had a heart attack or chest pain.  Are at risk for a heart attack.  Have undergone open-heart surgery, such as coronary artery bypass surgery (CABG).  Have had coronary angioplasty or a stent.  Have had certain types of stroke or transient ischemic attack (TIA).  Have peripheral artery disease (PAD).  Have chronic heart rhythm problems such as atrial fibrillation and cannot take an  anticoagulant.  Have valve disease or have had surgery on a valve. What are the risks? Daily use of aspirin can cause side effects. Some of these include:  Bleeding. Bleeding problems can be minor or serious. An example of a  minor problem is a cut that does not stop bleeding. An example of a more serious problem is stomach bleeding or, rarely, bleeding into the brain. Your risk of bleeding is increased if you are also taking non-steroidal anti-inflammatory drugs (NSAIDs).  Increased bruising.  Upset stomach.  An allergic reaction. People who have nasal polyps have an increased risk of developing an aspirin allergy. General guidelines  Take aspirin only as told by your health care provider. Make sure that you understand how much you should take and what form you should take. The two forms of aspirin are: ? Non-enteric-coated.This type of aspirin does not have a coating and is absorbed quickly. This type of aspirin also comes in a chewable form. ? Enteric-coated. This type of aspirin has a coating that releases the medicine very slowly. Enteric-coated aspirin might cause less stomach upset than non-enteric-coated aspirin. This type of aspirin should not be chewed or crushed.  Limit alcohol intake to no more than 1 drink a day for nonpregnant women and 2 drinks a day for men. Drinking alcohol increases your risk of bleeding. One drink equals 12 oz of beer, 5 oz of wine, or 1 oz of hard liquor. Contact a health care provider if you:  Have unusual bleeding or bruising.  Have stomach pain or nausea.  Have ringing in your ears.  Have an allergic reaction that causes: ? Hives. ? Itchy skin. ? Swelling of the lips, tongue, or face. Get help right away if you:  Notice that your bowel movements are bloody, dark red, or black in color.  Vomit or cough up blood.  Have blood in your urine.  Cough, have noisy breathing (wheeze), or feel short of breath.  Have chest pain, especially if the pain spreads to the arms, back, neck, or jaw.  Have a severe headache, or a headache with confusion, or dizziness. These symptoms may represent a serious problem that is an emergency. Do not wait to see if the symptoms will go  away. Get medical help right away. Call your local emergency services (911 in the [place] Do not drive yourself to the hospital. Summary  Aspirin can be used to help reduce the risk of blood clots, heart attacks, and other heart-related problems.  Daily use of aspirin can increase your risk of side effects. Your health care provider will help you determine whether it is safe and beneficial for you to take aspirin daily.  Take aspirin only as told by your health care provider. Make sure that you understand how much you can take and what form you can take. This information is not intended to replace advice given to you by your health care provider. Make sure you discuss any questions you have with your health care provider. Document Released: 07/27/2008 Document Revised: 06/14/2017 Document Reviewed: 06/14/2017 Elsevier Patient Education  2020 [place]

## 2019-06-02 NOTE — Addendum Note (Signed)
Addended by: Austin Miles on: 06/02/2019 05:16 PM   Modules accepted: Orders

## 2019-06-02 NOTE — Progress Notes (Signed)
Cardiology Office Note:    Date:  06/02/2019   ID:  Amber Sexton, DOB [birth year], MRN [id number]  PCP:  Care, [place] Better  Cardiologist:  Jenean Lindau, MD   Referring MD: Care, [place]:    1. Essential hypertension   2. Sleep apnea, unspecified type   3. Ex-cigarette smoker   4. Dyspnea on exertion   5. Abnormal nuclear stress test   6. Angina pectoris ([place])    PLAN:    In order of problems listed above:  1. Abnormal nuclear stress test: Patient symptoms are very concerning.  Details of the test are mentioned below.I discussed coronary angiography and left heart catheterization with the patient at extensive length. Procedure, benefits and potential risks were explained. Patient had multiple questions which were answered to the patient's satisfaction. Patient agreed and consented for the procedure. Further recommendations will be made based on the findings of the coronary angiography. In the interim. The patient has any significant symptoms he knows to go to the nearest emergency room. 2. In view of the above asked the patient to take a coated aspirin on a daily basis 81 mg, metoprolol succinate 25 mg daily.  Sublingual nitroglycerin prescription was sent, its protocol and 911 protocol explained and the patient vocalized understanding questions were answered to the patient's satisfaction 3. Mixed dyslipidemia: Lipids were reviewed with the patient and diet was discussed and diet was discussed also for reducing weight.  She will be seen in follow-up appointment after the coronary geography.  Weight reduction was stressed and risks of obesity explained.   Medication Adjustments/Labs and Tests Ordered: Current medicines are reviewed at length with the patient today.  Concerns regarding medicines are outlined above.  Orders Placed This Encounter  Procedures  . EKG 12-Lead   No orders of the defined types were placed in this encounter.     Chief Complaint  Patient presents with  . Follow-up     History of Present Illness:    Amber Sexton is a 63 y.o. female.  Patient has past medical history of smoking, essential hypertension and dyslipidemia.  This test was done for shortness of breath on exertion but based on the patient's history the daughter mentions to me now that every time she goes to the grocery store and walks with the mask she complains of chest tightness.  This is clearly suggesting of angina pectoris and the patient had not given a similar history in the past.  At the time of my evaluation, the patient is alert awake oriented and in no distress.  Past Medical History:  Diagnosis Date  . Arthritis   . Chest pain 07/21/2015  . COPD (chronic obstructive pulmonary disease) ([place])   . Essential hypertension 07/21/2015  . Ex-cigarette smoker 07/21/2015  . Meniere disease   . Pulmonary hypertension ([place])     Past Surgical History:  Procedure Laterality Date  . ABDOMINAL HYSTERECTOMY    . TONSILLECTOMY      Current Medications: Current Meds  Medication Sig  . albuterol (PROAIR HFA) 108 (90 Base) MCG/ACT inhaler INHALE 2 PUFFS BY MOUTH EVERY 4 TO 6 HOURS AS NEEDED     Allergies:   Ampicillin   Social History   Socioeconomic History  . Marital status: Divorced    Spouse name: Not on file  . Number of children: Not on file  . Years of education: Not on file  . Highest education level: Not on file  Occupational  History  . Not on file  Social Needs  . Financial resource strain: Not on file  . Food insecurity    Worry: Not on file    Inability: Not on file  . Transportation needs    Medical: Not on file    Non-medical: Not on file  Tobacco Use  . Smoking status: Former Smoker    Packs/day: 1.00    Types: Cigarettes    Quit date: 2014    Years since quitting: 6.7  . Smokeless tobacco: Never Used  Substance and Sexual Activity  . Alcohol use: No  . Drug use: No  . Sexual activity: Not on  file  Lifestyle  . Physical activity    Days per week: Not on file    Minutes per session: Not on file  . Stress: Not on file  Relationships  . Social Herbalist on phone: Not on file    Gets together: Not on file    Attends religious service: Not on file    Active member of club or organization: Not on file    Attends meetings of clubs or organizations: Not on file    Relationship status: Not on file  Other Topics Concern  . Not on file  Social History Narrative  . Not on file     Family History: The patient's family history includes Breast cancer in her sister; Colon cancer in her brother; Diabetes in her brother, brother, and sister; Hypertension in her brother, brother, and sister.  ROS:   Please see the history of present illness.    All other systems reviewed and are negative.  EKGs/Labs/Other Studies Reviewed:    The following studies were reviewed today: Study Highlights    Nuclear stress EF: 65%.  The left ventricular ejection fraction is normal (55-65%).  There was no ST segment deviation noted during stress.  No T wave inversion was noted during stress.  Defect 1: There is a medium defect of moderate severity present in the basal inferoseptal and basal inferior location. This defect shows partial reversibility.  There is noted mild hypokinesis in the basal inferior and basal inferoseptal wall.  This is an abnormal study. Findings consistent with prior myocardial infarction with peri-infarct ischemia.  This is a high risk study.     IMPRESSIONS    1. Left ventricular ejection fraction, by visual estimation, is 55-60%. The left ventricle has normal function. Normal left ventricular size. There is no left ventricular hypertrophy.  2. Left ventricular diastolic Doppler parameters are consistent with pseudonormalization pattern of LV diastolic filling (Grade II Diastolic Dysfunction).  3. Borderline left ventricular global longitudinal strain  ( -11.2 %).  4. Global right ventricle has normal systolic function.The right ventricular size is normal. No increase in right ventricular wall thickness.  5. Left atrial size was normal.  6. Right atrial size was normal.  7. Mild aortic valve annular calcification.  8. The aortic valve is normal in structure. Aortic valve regurgitation was not visualized by color flow Doppler. Structurally normal aortic valve, with no evidence of sclerosis or stenosis.  9. The mitral valve is normal in structure. No evidence of mitral valve regurgitation. No evidence of mitral stenosis. 10. The tricuspid valve is normal in structure. Tricuspid valve regurgitation is trivial. 11. The pulmonic valve was normal in structure. Pulmonic valve regurgitation is trivial by color flow Doppler. 12. Mildly elevated pulmonary artery systolic pressure.   Recent Labs: No results found for requested labs within last  8760 hours.  Recent Lipid Panel No results found for: CHOL, TRIG, HDL, CHOLHDL, VLDL, LDLCALC, LDLDIRECT  Physical Exam:    VS:  BP 132/86 (BP Location: Left Arm, Patient Position: Sitting, Cuff Size: Large)   Pulse 83   Ht 5\' 4"  (1.626 m)   Wt 240 lb (108.9 kg)   SpO2 97%   BMI 41.20 kg/m     Wt Readings from Last 3 Encounters:  06/02/19 240 lb (108.9 kg)  05/27/19 232 lb (105.2 kg)  04/28/19 232 lb (105.2 kg)     GEN: Patient is in no acute distress HEENT: Normal NECK: No JVD; No carotid bruits LYMPHATICS: No lymphadenopathy CARDIAC: Hear sounds regular, 2/6 systolic murmur at the apex. RESPIRATORY:  Clear to auscultation without rales, wheezing or rhonchi  ABDOMEN: Soft, non-tender, non-distended MUSCULOSKELETAL:  No edema; No deformity  SKIN: Warm and dry NEUROLOGIC:  Alert and oriented x 3 PSYCHIATRIC:  Normal affect   Signed, Jenean Lindau, MD  06/02/2019 3:03 PM    [facility]

## 2019-06-02 NOTE — Telephone Encounter (Signed)
Nitroglycerin prescription has been sent to [facility] and instructions reviewed by Annye Asa, RN during discharge for patient today, 06/02/2019.

## 2019-06-02 NOTE — H&P (View-Only) (Signed)
Cardiology Office Note:    Date:  06/02/2019   ID:  Amber Sexton, DOB [birth year], MRN [id number]  PCP:  Care, [place] Better  Cardiologist:  Jenean Lindau, MD   Referring MD: Care, [place]:    1. Essential hypertension   2. Sleep apnea, unspecified type   3. Ex-cigarette smoker   4. Dyspnea on exertion   5. Abnormal nuclear stress test   6. Angina pectoris ([place])    PLAN:    In order of problems listed above:  1. Abnormal nuclear stress test: Patient symptoms are very concerning.  Details of the test are mentioned below.I discussed coronary angiography and left heart catheterization with the patient at extensive length. Procedure, benefits and potential risks were explained. Patient had multiple questions which were answered to the patient's satisfaction. Patient agreed and consented for the procedure. Further recommendations will be made based on the findings of the coronary angiography. In the interim. The patient has any significant symptoms he knows to go to the nearest emergency room. 2. In view of the above asked the patient to take a coated aspirin on a daily basis 81 mg, metoprolol succinate 25 mg daily.  Sublingual nitroglycerin prescription was sent, its protocol and 911 protocol explained and the patient vocalized understanding questions were answered to the patient's satisfaction 3. Mixed dyslipidemia: Lipids were reviewed with the patient and diet was discussed and diet was discussed also for reducing weight.  She will be seen in follow-up appointment after the coronary geography.  Weight reduction was stressed and risks of obesity explained.   Medication Adjustments/Labs and Tests Ordered: Current medicines are reviewed at length with the patient today.  Concerns regarding medicines are outlined above.  Orders Placed This Encounter  Procedures  . EKG 12-Lead   No orders of the defined types were placed in this encounter.     Chief Complaint  Patient presents with  . Follow-up     History of Present Illness:    Amber Sexton is a 63 y.o. female.  Patient has past medical history of smoking, essential hypertension and dyslipidemia.  This test was done for shortness of breath on exertion but based on the patient's history the daughter mentions to me now that every time she goes to the grocery store and walks with the mask she complains of chest tightness.  This is clearly suggesting of angina pectoris and the patient had not given a similar history in the past.  At the time of my evaluation, the patient is alert awake oriented and in no distress.  Past Medical History:  Diagnosis Date  . Arthritis   . Chest pain 07/21/2015  . COPD (chronic obstructive pulmonary disease) ([place])   . Essential hypertension 07/21/2015  . Ex-cigarette smoker 07/21/2015  . Meniere disease   . Pulmonary hypertension ([place])     Past Surgical History:  Procedure Laterality Date  . ABDOMINAL HYSTERECTOMY    . TONSILLECTOMY      Current Medications: Current Meds  Medication Sig  . albuterol (PROAIR HFA) 108 (90 Base) MCG/ACT inhaler INHALE 2 PUFFS BY MOUTH EVERY 4 TO 6 HOURS AS NEEDED     Allergies:   Ampicillin   Social History   Socioeconomic History  . Marital status: Divorced    Spouse name: Not on file  . Number of children: Not on file  . Years of education: Not on file  . Highest education level: Not on file  Occupational  History  . Not on file  Social Needs  . Financial resource strain: Not on file  . Food insecurity    Worry: Not on file    Inability: Not on file  . Transportation needs    Medical: Not on file    Non-medical: Not on file  Tobacco Use  . Smoking status: Former Smoker    Packs/day: 1.00    Types: Cigarettes    Quit date: 2014    Years since quitting: 6.7  . Smokeless tobacco: Never Used  Substance and Sexual Activity  . Alcohol use: No  . Drug use: No  . Sexual activity: Not on  file  Lifestyle  . Physical activity    Days per week: Not on file    Minutes per session: Not on file  . Stress: Not on file  Relationships  . Social Herbalist on phone: Not on file    Gets together: Not on file    Attends religious service: Not on file    Active member of club or organization: Not on file    Attends meetings of clubs or organizations: Not on file    Relationship status: Not on file  Other Topics Concern  . Not on file  Social History Narrative  . Not on file     Family History: The patient's family history includes Breast cancer in her sister; Colon cancer in her brother; Diabetes in her brother, brother, and sister; Hypertension in her brother, brother, and sister.  ROS:   Please see the history of present illness.    All other systems reviewed and are negative.  EKGs/Labs/Other Studies Reviewed:    The following studies were reviewed today: Study Highlights    Nuclear stress EF: 65%.  The left ventricular ejection fraction is normal (55-65%).  There was no ST segment deviation noted during stress.  No T wave inversion was noted during stress.  Defect 1: There is a medium defect of moderate severity present in the basal inferoseptal and basal inferior location. This defect shows partial reversibility.  There is noted mild hypokinesis in the basal inferior and basal inferoseptal wall.  This is an abnormal study. Findings consistent with prior myocardial infarction with peri-infarct ischemia.  This is a high risk study.     IMPRESSIONS    1. Left ventricular ejection fraction, by visual estimation, is 55-60%. The left ventricle has normal function. Normal left ventricular size. There is no left ventricular hypertrophy.  2. Left ventricular diastolic Doppler parameters are consistent with pseudonormalization pattern of LV diastolic filling (Grade II Diastolic Dysfunction).  3. Borderline left ventricular global longitudinal strain  ( -11.2 %).  4. Global right ventricle has normal systolic function.The right ventricular size is normal. No increase in right ventricular wall thickness.  5. Left atrial size was normal.  6. Right atrial size was normal.  7. Mild aortic valve annular calcification.  8. The aortic valve is normal in structure. Aortic valve regurgitation was not visualized by color flow Doppler. Structurally normal aortic valve, with no evidence of sclerosis or stenosis.  9. The mitral valve is normal in structure. No evidence of mitral valve regurgitation. No evidence of mitral stenosis. 10. The tricuspid valve is normal in structure. Tricuspid valve regurgitation is trivial. 11. The pulmonic valve was normal in structure. Pulmonic valve regurgitation is trivial by color flow Doppler. 12. Mildly elevated pulmonary artery systolic pressure.   Recent Labs: No results found for requested labs within last  8760 hours.  Recent Lipid Panel No results found for: CHOL, TRIG, HDL, CHOLHDL, VLDL, LDLCALC, LDLDIRECT  Physical Exam:    VS:  BP 132/86 (BP Location: Left Arm, Patient Position: Sitting, Cuff Size: Large)   Pulse 83   Ht 5\' 4"  (1.626 m)   Wt 240 lb (108.9 kg)   SpO2 97%   BMI 41.20 kg/m     Wt Readings from Last 3 Encounters:  06/02/19 240 lb (108.9 kg)  05/27/19 232 lb (105.2 kg)  04/28/19 232 lb (105.2 kg)     GEN: Patient is in no acute distress HEENT: Normal NECK: No JVD; No carotid bruits LYMPHATICS: No lymphadenopathy CARDIAC: Hear sounds regular, 2/6 systolic murmur at the apex. RESPIRATORY:  Clear to auscultation without rales, wheezing or rhonchi  ABDOMEN: Soft, non-tender, non-distended MUSCULOSKELETAL:  No edema; No deformity  SKIN: Warm and dry NEUROLOGIC:  Alert and oriented x 3 PSYCHIATRIC:  Normal affect   Signed, Jenean Lindau, MD  06/02/2019 3:03 PM    [facility]

## 2019-06-03 LAB — BASIC METABOLIC PANEL
BUN/Creatinine Ratio: 18 (ref 12–28)
BUN: 14 mg/dL (ref 8–27)
CO2: 25 mmol/L (ref 20–29)
Calcium: 9 mg/dL (ref 8.7–10.3)
Chloride: 103 mmol/L (ref 96–106)
Creatinine, Ser: 0.78 mg/dL (ref 0.57–1.00)
GFR calc Af Amer: 95 mL/min/{1.73_m2} (ref >59)
GFR calc non Af Amer: 82 mL/min/{1.73_m2} (ref >59)
Glucose: 85 mg/dL (ref 65–99)
Potassium: 3.8 mmol/L (ref 3.5–5.2)
Sodium: 140 mmol/L (ref 134–144)

## 2019-06-03 LAB — CBC
Hematocrit: 39.6 % (ref 34.0–46.6)
Hemoglobin: 12.9 g/dL (ref 11.1–15.9)
MCH: 27.6 pg (ref 26.6–33.0)
MCHC: 32.6 g/dL (ref 31.5–35.7)
MCV: 85 fL (ref 79–97)
Platelets: 265 10*3/uL (ref 150–450)
RBC: 4.67 x10E6/uL (ref 3.77–5.28)
RDW: 13.4 % (ref 11.7–15.4)
WBC: 7.7 10*3/uL (ref 3.4–10.8)

## 2019-06-04 ENCOUNTER — Other Ambulatory Visit (HOSPITAL_COMMUNITY)
Admission: RE | Admit: 2019-06-04 | Discharge: 2019-06-04 | Disposition: A | Discharge status: Home / Self Care | Payer: Medicare HMO | Source: Ambulatory Visit | Attending: Interventional Cardiology | Admitting: Interventional Cardiology

## 2019-06-04 DIAGNOSIS — Z01812 Encounter for preprocedural laboratory examination: Secondary | ICD-10-CM | POA: Diagnosis not present

## 2019-06-04 DIAGNOSIS — Z20828 Contact with and (suspected) exposure to other viral communicable diseases: Secondary | ICD-10-CM | POA: Insufficient documentation

## 2019-06-05 ENCOUNTER — Telehealth: Payer: Self-pay | Admitting: *Deleted

## 2019-06-05 LAB — NOVEL CORONAVIRUS, NAA (HOSP ORDER, SEND-OUT TO REF LAB; TAT 18-24 HRS): SARS-CoV-2, NAA: NOT DETECTED

## 2019-06-05 NOTE — Telephone Encounter (Signed)
Pt contacted pre-catheterization scheduled at [facility] for: Monday June 09, 2019 7:30AM Verified arrival time and place: [facility]) at: 5:30 AM   No solid food after midnight prior to cath, clear liquids until 5 AM day of procedure. Contrast allergy: no   AM meds can be  taken pre-cath with sip of water including: ASA 81 mg   Confirmed patient has responsible adult to drive home post procedure and observe 24 hours after arriving home: yes  Currently, due to Covid-19 pandemic, only one support person will be allowed with patient. Must be the same support person for that patient's entire stay, will be screened and required to wear a mask. They will be asked to wait in the waiting room for the duration of the patient's stay.  Patients are required to wear a mask when they enter the hospital.      COVID-19 Pre-Screening Questions:  . In the past 7 to 10 days have you had a cough,  shortness of breath, headache, congestion, fever (100 or greater) body aches, chills, sore throat, or sudden loss of taste or sense of smell? no . Have you been around anyone with known Covid 19? no . Have you been around anyone who is awaiting Covid 19 test results in the past 7 to 10 days? no . Have you been around anyone who has been exposed to Covid 19, or has mentioned symptoms of Covid 19 within the past 7 to 10 days? no  I reviewed procedure/mask/visitor instructions, Covid-19 screening questions with patient, she verbalized understanding.

## 2019-06-09 ENCOUNTER — Ambulatory Visit (HOSPITAL_COMMUNITY)
Admission: RE | Admit: 2019-06-09 | Discharge: 2019-06-10 | Disposition: A | Discharge status: Home / Self Care | Payer: Medicare HMO | Attending: Interventional Cardiology | Admitting: Interventional Cardiology

## 2019-06-09 ENCOUNTER — Other Ambulatory Visit: Payer: Self-pay

## 2019-06-09 ENCOUNTER — Encounter (HOSPITAL_COMMUNITY)
Admission: RE | Disposition: A | Discharge status: Home / Self Care | Payer: Self-pay | Source: Home / Self Care | Attending: Interventional Cardiology

## 2019-06-09 ENCOUNTER — Encounter (HOSPITAL_COMMUNITY): Payer: Self-pay | Admitting: Interventional Cardiology

## 2019-06-09 DIAGNOSIS — E785 Hyperlipidemia, unspecified: Secondary | ICD-10-CM | POA: Diagnosis not present

## 2019-06-09 DIAGNOSIS — R001 Bradycardia, unspecified: Secondary | ICD-10-CM | POA: Insufficient documentation

## 2019-06-09 DIAGNOSIS — J449 Chronic obstructive pulmonary disease, unspecified: Secondary | ICD-10-CM | POA: Insufficient documentation

## 2019-06-09 DIAGNOSIS — I272 Pulmonary hypertension, unspecified: Secondary | ICD-10-CM | POA: Diagnosis not present

## 2019-06-09 DIAGNOSIS — I25119 Atherosclerotic heart disease of native coronary artery with unspecified angina pectoris: Secondary | ICD-10-CM

## 2019-06-09 DIAGNOSIS — G4730 Sleep apnea, unspecified: Secondary | ICD-10-CM

## 2019-06-09 DIAGNOSIS — Z79899 Other long term (current) drug therapy: Secondary | ICD-10-CM | POA: Insufficient documentation

## 2019-06-09 DIAGNOSIS — R0609 Other forms of dyspnea: Secondary | ICD-10-CM | POA: Diagnosis present

## 2019-06-09 DIAGNOSIS — Z7902 Long term (current) use of antithrombotics/antiplatelets: Secondary | ICD-10-CM | POA: Diagnosis not present

## 2019-06-09 DIAGNOSIS — Z955 Presence of coronary angioplasty implant and graft: Secondary | ICD-10-CM

## 2019-06-09 DIAGNOSIS — R9439 Abnormal result of other cardiovascular function study: Secondary | ICD-10-CM

## 2019-06-09 DIAGNOSIS — R0600 Dyspnea, unspecified: Secondary | ICD-10-CM

## 2019-06-09 DIAGNOSIS — Z880 Allergy status to penicillin: Secondary | ICD-10-CM | POA: Diagnosis not present

## 2019-06-09 DIAGNOSIS — I2510 Atherosclerotic heart disease of native coronary artery without angina pectoris: Secondary | ICD-10-CM | POA: Diagnosis present

## 2019-06-09 DIAGNOSIS — Z7982 Long term (current) use of aspirin: Secondary | ICD-10-CM | POA: Insufficient documentation

## 2019-06-09 DIAGNOSIS — I10 Essential (primary) hypertension: Secondary | ICD-10-CM | POA: Diagnosis not present

## 2019-06-09 DIAGNOSIS — Z87891 Personal history of nicotine dependence: Secondary | ICD-10-CM | POA: Diagnosis not present

## 2019-06-09 DIAGNOSIS — E669 Obesity, unspecified: Secondary | ICD-10-CM | POA: Diagnosis not present

## 2019-06-09 DIAGNOSIS — G4733 Obstructive sleep apnea (adult) (pediatric): Secondary | ICD-10-CM | POA: Insufficient documentation

## 2019-06-09 DIAGNOSIS — Z6841 Body Mass Index (BMI) 40.0 and over, adult: Secondary | ICD-10-CM | POA: Diagnosis not present

## 2019-06-09 DIAGNOSIS — I209 Angina pectoris, unspecified: Secondary | ICD-10-CM | POA: Diagnosis present

## 2019-06-09 HISTORY — PX: LEFT HEART CATH AND CORONARY ANGIOGRAPHY: CATH118249

## 2019-06-09 HISTORY — PX: CORONARY STENT INTERVENTION: CATH118234

## 2019-06-09 HISTORY — DX: Atherosclerotic heart disease of native coronary artery without angina pectoris: I25.10

## 2019-06-09 LAB — CBC
HCT: 38.7 % (ref 36.0–46.0)
Hemoglobin: 12.5 g/dL (ref 12.0–15.0)
MCH: 28 pg (ref 26.0–34.0)
MCHC: 32.3 g/dL (ref 30.0–36.0)
MCV: 86.8 fL (ref 80.0–100.0)
Platelets: 230 10*3/uL (ref 150–400)
RBC: 4.46 MIL/uL (ref 3.87–5.11)
RDW: 13.5 % (ref 11.5–15.5)
WBC: 7 10*3/uL (ref 4.0–10.5)
nRBC: 0 % (ref 0.0–0.2)

## 2019-06-09 LAB — CREATININE, SERUM
Creatinine, Ser: 0.68 mg/dL (ref 0.44–1.00)
GFR calc Af Amer: >60 mL/min (ref >60)
GFR calc non Af Amer: >60 mL/min (ref >60)

## 2019-06-09 LAB — POCT ACTIVATED CLOTTING TIME
Activated Clotting Time: 257 seconds
Activated Clotting Time: 268 seconds
Activated Clotting Time: 285 seconds

## 2019-06-09 SURGERY — LEFT HEART CATH AND CORONARY ANGIOGRAPHY
Anesthesia: LOCAL

## 2019-06-09 MED ORDER — SODIUM CHLORIDE 0.9 % IV SOLN: 250.0000 mL | INTRAVENOUS | Status: DC | PRN

## 2019-06-09 MED ORDER — HEPARIN SODIUM (PORCINE) 1000 UNIT/ML IJ SOLN
INTRAMUSCULAR | Status: AC
  Filled 2019-06-09: qty 1

## 2019-06-09 MED ORDER — TICAGRELOR 90 MG PO TABS
ORAL_TABLET | ORAL | Status: AC
  Filled 2019-06-09: qty 1

## 2019-06-09 MED ORDER — VERAPAMIL HCL 2.5 MG/ML IV SOLN
INTRAVENOUS | Status: AC
  Filled 2019-06-09: qty 2

## 2019-06-09 MED ORDER — SODIUM CHLORIDE 0.9 % WEIGHT BASED INFUSION: 1.0000 mL/kg/h | INTRAVENOUS | Status: DC

## 2019-06-09 MED ORDER — FENTANYL CITRATE (PF) 100 MCG/2ML IJ SOLN
INTRAMUSCULAR | Status: AC
  Filled 2019-06-09: qty 2

## 2019-06-09 MED ORDER — FENTANYL CITRATE (PF) 100 MCG/2ML IJ SOLN
INTRAMUSCULAR | Status: DC | PRN
  Administered 2019-06-09: 25 ug via INTRAVENOUS

## 2019-06-09 MED ORDER — ASPIRIN 81 MG PO CHEW: 81.0000 mg | CHEWABLE_TABLET | Freq: Every day | ORAL | Status: DC

## 2019-06-09 MED ORDER — IOHEXOL 350 MG/ML SOLN
INTRAVENOUS | Status: DC | PRN
  Administered 2019-06-09: 115 mL

## 2019-06-09 MED ORDER — MIDAZOLAM HCL 2 MG/2ML IJ SOLN
INTRAMUSCULAR | Status: AC
  Filled 2019-06-09: qty 2

## 2019-06-09 MED ORDER — MIDAZOLAM HCL 2 MG/2ML IJ SOLN
INTRAMUSCULAR | Status: DC | PRN
  Administered 2019-06-09: 1 mg via INTRAVENOUS

## 2019-06-09 MED ORDER — LIDOCAINE HCL (PF) 1 % IJ SOLN
INTRAMUSCULAR | Status: DC | PRN
  Administered 2019-06-09: 3 mL

## 2019-06-09 MED ORDER — FAMOTIDINE IN NACL 20-0.9 MG/50ML-% IV SOLN
20.0000 mg | Freq: Once | INTRAVENOUS | Status: AC
  Administered 2019-06-09: 20 mg via INTRAVENOUS

## 2019-06-09 MED ORDER — OXYCODONE HCL 5 MG PO TABS: 5.0000 mg | ORAL_TABLET | ORAL | Status: DC | PRN

## 2019-06-09 MED ORDER — ATORVASTATIN CALCIUM 80 MG PO TABS
80.0000 mg | ORAL_TABLET | Freq: Every day | ORAL | Status: DC
  Administered 2019-06-09: 80 mg via ORAL
  Filled 2019-06-09 (×2): qty 1

## 2019-06-09 MED ORDER — HEPARIN SODIUM (PORCINE) 5000 UNIT/ML IJ SOLN: 5000.0000 [IU] | Freq: Three times a day (TID) | INTRAMUSCULAR | Status: DC

## 2019-06-09 MED ORDER — FENTANYL CITRATE (PF) 100 MCG/2ML IJ SOLN
25.0000 ug | INTRAMUSCULAR | Status: DC | PRN
  Administered 2019-06-09: 25 ug via INTRAVENOUS

## 2019-06-09 MED ORDER — SODIUM CHLORIDE 0.9% FLUSH: 3.0000 mL | Freq: Two times a day (BID) | INTRAVENOUS | Status: DC

## 2019-06-09 MED ORDER — HEPARIN (PORCINE) IN NACL 1000-0.9 UT/500ML-% IV SOLN
INTRAVENOUS | Status: AC
  Filled 2019-06-09: qty 1000

## 2019-06-09 MED ORDER — SODIUM CHLORIDE 0.9 % WEIGHT BASED INFUSION
3.0000 mL/kg/h | INTRAVENOUS | Status: DC
  Administered 2019-06-09: 07:00:00 3 mL/kg/h via INTRAVENOUS

## 2019-06-09 MED ORDER — ASPIRIN EC 81 MG PO TBEC
81.0000 mg | DELAYED_RELEASE_TABLET | Freq: Every day | ORAL | Status: DC
  Administered 2019-06-10: 09:00:00 81 mg via ORAL
  Filled 2019-06-09: qty 1

## 2019-06-09 MED ORDER — HEPARIN SODIUM (PORCINE) 1000 UNIT/ML IJ SOLN
INTRAMUSCULAR | Status: DC | PRN
  Administered 2019-06-09: 2500 [IU] via INTRAVENOUS
  Administered 2019-06-09: 5000 [IU] via INTRAVENOUS
  Administered 2019-06-09: 2000 [IU] via INTRAVENOUS
  Administered 2019-06-09: 5000 [IU] via INTRAVENOUS

## 2019-06-09 MED ORDER — SODIUM CHLORIDE 0.9% FLUSH: 3.0000 mL | INTRAVENOUS | Status: DC | PRN

## 2019-06-09 MED ORDER — TICAGRELOR 90 MG PO TABS
90.0000 mg | ORAL_TABLET | Freq: Two times a day (BID) | ORAL | Status: DC
  Administered 2019-06-09 – 2019-06-10 (×2): 90 mg via ORAL
  Filled 2019-06-09 (×3): qty 1

## 2019-06-09 MED ORDER — ONDANSETRON HCL 4 MG/2ML IJ SOLN: 4.0000 mg | Freq: Four times a day (QID) | INTRAMUSCULAR | Status: DC | PRN

## 2019-06-09 MED ORDER — HEPARIN (PORCINE) IN NACL 1000-0.9 UT/500ML-% IV SOLN
INTRAVENOUS | Status: DC | PRN
  Administered 2019-06-09 (×2): 500 mL

## 2019-06-09 MED ORDER — HYDRALAZINE HCL 20 MG/ML IJ SOLN: 10.0000 mg | INTRAMUSCULAR | Status: AC | PRN

## 2019-06-09 MED ORDER — ALBUTEROL SULFATE (2.5 MG/3ML) 0.083% IN NEBU: 2.5000 mg | INHALATION_SOLUTION | RESPIRATORY_TRACT | Status: DC | PRN

## 2019-06-09 MED ORDER — NETARSUDIL-LATANOPROST 0.02-0.005 % OP SOLN: 1.0000 [drp] | Freq: Every day | OPHTHALMIC | Status: DC

## 2019-06-09 MED ORDER — ATROPINE SULFATE 1 MG/10ML IJ SOSY
PREFILLED_SYRINGE | INTRAMUSCULAR | Status: AC
  Filled 2019-06-09: qty 10

## 2019-06-09 MED ORDER — NITROGLYCERIN 0.4 MG SL SUBL: 0.4000 mg | SUBLINGUAL_TABLET | SUBLINGUAL | Status: DC | PRN

## 2019-06-09 MED ORDER — ATROPINE SULFATE 1 MG/10ML IJ SOSY
PREFILLED_SYRINGE | INTRAMUSCULAR | Status: DC | PRN
  Administered 2019-06-09: 0.5 mg via INTRAVENOUS

## 2019-06-09 MED ORDER — VERAPAMIL HCL 2.5 MG/ML IV SOLN
INTRAVENOUS | Status: DC | PRN
  Administered 2019-06-09: 10 mL via INTRA_ARTERIAL

## 2019-06-09 MED ORDER — ASPIRIN 81 MG PO CHEW: 81.0000 mg | CHEWABLE_TABLET | ORAL | Status: DC

## 2019-06-09 MED ORDER — ACETAMINOPHEN 325 MG PO TABS
650.0000 mg | ORAL_TABLET | ORAL | Status: DC | PRN
  Administered 2019-06-09 – 2019-06-10 (×2): 650 mg via ORAL
  Filled 2019-06-09 (×2): qty 2

## 2019-06-09 MED ORDER — SODIUM CHLORIDE 0.9% FLUSH
3.0000 mL | Freq: Two times a day (BID) | INTRAVENOUS | Status: DC
  Administered 2019-06-09: 3 mL via INTRAVENOUS

## 2019-06-09 MED ORDER — FAMOTIDINE IN NACL 20-0.9 MG/50ML-% IV SOLN
INTRAVENOUS | Status: AC
  Filled 2019-06-09: qty 50

## 2019-06-09 MED ORDER — TICAGRELOR 90 MG PO TABS
ORAL_TABLET | ORAL | Status: DC | PRN
  Administered 2019-06-09: 180 mg via ORAL

## 2019-06-09 MED ORDER — SODIUM CHLORIDE 0.9 % WEIGHT BASED INFUSION: 1.0000 mL/kg/h | INTRAVENOUS | Status: AC

## 2019-06-09 MED ORDER — LIDOCAINE HCL (PF) 1 % IJ SOLN
INTRAMUSCULAR | Status: AC
  Filled 2019-06-09: qty 30

## 2019-06-09 SURGICAL SUPPLY — 17 items
BALLN SAPPHIRE 3.0X15 (BALLOONS) ×2
BALLN SAPPHIRE ~~LOC~~ 4.0X12 (BALLOONS) ×2 IMPLANT
BALLOON SAPPHIRE 3.0X15 (BALLOONS) ×1 IMPLANT
CATH 5FR JL3.5 JR4 ANG PIG MP (CATHETERS) ×2 IMPLANT
CATH VISTA GUIDE 6FR JR4 (CATHETERS) ×2 IMPLANT
DEVICE RAD COMP TR BAND LRG (VASCULAR PRODUCTS) ×2 IMPLANT
ELECT DEFIB PAD ADLT CADENCE (PAD) ×2 IMPLANT
GLIDESHEATH SLEND A-KIT 6F 22G (SHEATH) ×2 IMPLANT
GUIDEWIRE INQWIRE 1.5J.035X260 (WIRE) ×1 IMPLANT
INQWIRE 1.5J .035X260CM (WIRE) ×2
KIT ENCORE 26 ADVANTAGE (KITS) ×2 IMPLANT
KIT HEART LEFT (KITS) ×2 IMPLANT
PACK CARDIAC CATHETERIZATION (CUSTOM PROCEDURE TRAY) ×2 IMPLANT
STENT RESOLUTE ONYX 3.5X22 (Permanent Stent) ×2 IMPLANT
TRANSDUCER W/STOPCOCK (MISCELLANEOUS) ×2 IMPLANT
TUBING CIL FLEX 10 FLL-RA (TUBING) ×2 IMPLANT
WIRE ASAHI PROWATER 180CM (WIRE) ×2 IMPLANT

## 2019-06-09 NOTE — Progress Notes (Signed)
Tr band in place on left ulnar artery. Patient has swelling of her left hand and slow capillary refill. Patient did have a constant spo2 of 95% as measured in left thumb. After multiple attempts to deflate the band by the unit and experiencing bleeding, oozing, I removed the tr band and held manual pressure for 25 minutes. Hemostasis was present with band removal. Manual pressure held anyway. Her hand is much less swollen , and less discolored. There is a small quarter sized hematoma distal to the stick site, with ecchymosis toward her left palm. Tegaderm dressing applied. Spo2 100%.  Bedrest instructions given with regard to not bending her wrist.  Left ulnar pulse palpable.

## 2019-06-09 NOTE — Interval H&P Note (Signed)
Cath Lab Visit (complete for each Cath Lab visit)  Clinical Evaluation Leading to the Procedure:   ACS: No.  Non-ACS:    Anginal Classification: CCS III  Anti-ischemic medical therapy: Minimal Therapy (1 class of medications)  Non-Invasive Test Results: High-risk stress test findings: cardiac mortality >3%/year  Prior CABG: No previous CABG      History and Physical Interval Note:  06/09/2019 7:10 AM  Amber Sexton  has presented today for surgery, with the diagnosis of abnormal nuc - angina.  The various methods of treatment have been discussed with the patient and family. After consideration of risks, benefits and other options for treatment, the patient has consented to  Procedure(s): LEFT HEART CATH AND CORONARY ANGIOGRAPHY (N/A) as a surgical intervention.  The patient's history has been reviewed, patient examined, no change in status, stable for surgery.  I have reviewed the patient's chart and labs.  Questions were answered to the patient's satisfaction.     Belva Crome III

## 2019-06-09 NOTE — CV Procedure (Addendum)
   Coronary angiography, RCA PCI, and left ventriculography via right ulnar.  Radial artery is patent but small in diameter.  Proximal RCA eccentric 95% stenosis, segmental.  3.5 x 22 Onyx postdilated to 4.0 in the proximal half.  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  Left coronary system is small and widely patent  LV function is normal.  Aspirin and Brilinta times at least 6 months.  We will place patient in stepdown because of transient bradycardia.  Hopefully with time, flow will resume in the SA nodal artery.  Burning chest discomfort postprocedure presumed related to occlusion of SA nodal artery.  Discharge in a.m. assuming stability of her rhythm and PCI result.

## 2019-06-10 DIAGNOSIS — Z79899 Other long term (current) drug therapy: Secondary | ICD-10-CM | POA: Diagnosis not present

## 2019-06-10 DIAGNOSIS — I10 Essential (primary) hypertension: Secondary | ICD-10-CM | POA: Diagnosis not present

## 2019-06-10 DIAGNOSIS — G4733 Obstructive sleep apnea (adult) (pediatric): Secondary | ICD-10-CM | POA: Diagnosis not present

## 2019-06-10 DIAGNOSIS — R001 Bradycardia, unspecified: Secondary | ICD-10-CM | POA: Diagnosis not present

## 2019-06-10 DIAGNOSIS — Z7902 Long term (current) use of antithrombotics/antiplatelets: Secondary | ICD-10-CM | POA: Diagnosis not present

## 2019-06-10 DIAGNOSIS — Z880 Allergy status to penicillin: Secondary | ICD-10-CM | POA: Diagnosis not present

## 2019-06-10 DIAGNOSIS — J449 Chronic obstructive pulmonary disease, unspecified: Secondary | ICD-10-CM | POA: Diagnosis not present

## 2019-06-10 DIAGNOSIS — Z7982 Long term (current) use of aspirin: Secondary | ICD-10-CM | POA: Diagnosis not present

## 2019-06-10 DIAGNOSIS — I25119 Atherosclerotic heart disease of native coronary artery with unspecified angina pectoris: Secondary | ICD-10-CM | POA: Diagnosis not present

## 2019-06-10 DIAGNOSIS — Z87891 Personal history of nicotine dependence: Secondary | ICD-10-CM | POA: Diagnosis not present

## 2019-06-10 LAB — CBC
HCT: 37.7 % (ref 36.0–46.0)
Hemoglobin: 12.1 g/dL (ref 12.0–15.0)
MCH: 27.8 pg (ref 26.0–34.0)
MCHC: 32.1 g/dL (ref 30.0–36.0)
MCV: 86.7 fL (ref 80.0–100.0)
Platelets: 236 10*3/uL (ref 150–400)
RBC: 4.35 MIL/uL (ref 3.87–5.11)
RDW: 13.5 % (ref 11.5–15.5)
WBC: 6.8 10*3/uL (ref 4.0–10.5)
nRBC: 0 % (ref 0.0–0.2)

## 2019-06-10 LAB — BASIC METABOLIC PANEL
Anion gap: 12 (ref 5–15)
BUN: 12 mg/dL (ref 8–23)
CO2: 23 mmol/L (ref 22–32)
Calcium: 9 mg/dL (ref 8.9–10.3)
Chloride: 106 mmol/L (ref 98–111)
Creatinine, Ser: 0.81 mg/dL (ref 0.44–1.00)
GFR calc Af Amer: >60 mL/min (ref >60)
GFR calc non Af Amer: >60 mL/min (ref >60)
Glucose, Bld: 123 mg/dL — ABNORMAL HIGH (ref 70–99)
Potassium: 4.2 mmol/L (ref 3.5–5.1)
Sodium: 141 mmol/L (ref 135–145)

## 2019-06-10 MED ORDER — TICAGRELOR 90 MG PO TABS: 90.0000 mg | ORAL_TABLET | Freq: Two times a day (BID) | ORAL | 0 refills | Status: DC

## 2019-06-10 MED ORDER — ATORVASTATIN CALCIUM 80 MG PO TABS: 80.0000 mg | ORAL_TABLET | Freq: Every day | ORAL | 11 refills | Status: DC

## 2019-06-10 MED ORDER — TICAGRELOR 90 MG PO TABS: 90.0000 mg | ORAL_TABLET | Freq: Two times a day (BID) | ORAL | 11 refills | Status: DC

## 2019-06-10 MED FILL — BRILINTA 90 MG TABLET: 90 | 30 days supply | Qty: 60 | Fill #0

## 2019-06-10 NOTE — Discharge Instructions (Signed)

## 2019-06-10 NOTE — Progress Notes (Signed)
Progress Note  Patient Name: Amber Sexton Date of Encounter: 06/10/2019  Primary Cardiologist: Dr. Geraldo Pitter  Subjective   No chest pain; ambulated this am  Inpatient Medications    Scheduled Meds: . aspirin EC  81 mg Oral Daily  . atorvastatin  80 mg Oral q1800  . heparin  5,000 Units Subcutaneous Q8H  . sodium chloride flush  3 mL Intravenous Q12H  . ticagrelor  90 mg Oral BID   Continuous Infusions: . sodium chloride     PRN Meds: sodium chloride, acetaminophen, albuterol, fentaNYL (SUBLIMAZE) injection, nitroGLYCERIN, ondansetron (ZOFRAN) IV, oxyCODONE, sodium chloride flush   Vital Signs    Vitals:   06/10/19 0359 06/10/19 0403 06/10/19 0836 06/10/19 1044  BP:  (!) 151/62 (!) 155/71 (!) 156/80  Pulse: 74 70 71 78  Resp: 11 12 17 18   Temp:  97.8 F (36.6 C)  97.7 F (36.5 C)  TempSrc:  Oral  Oral  SpO2: 97% 97% 99% 99%  Weight:      Height:        Intake/Output Summary (Last 24 hours) at 06/10/2019 1201 Last data filed at 06/10/2019 0900 Gross per 24 hour  Intake 240 ml  Output 2 ml  Net 238 ml    I/O since admission: +238  Filed Weights   06/09/19 0539  Weight: 108.9 kg    Telemetry    Sinus - Personally Reviewed  ECG    06/10/2019 ECG (independently read by me): NSR at 69, isolated PAC.   06/09/2019 Post procedure ECG (independently read by me): Junctional rhythm at 50  Physical Exam   BP (!) 156/80 (BP Location: Left Arm)   Pulse 78   Temp 97.7 F (36.5 C) (Oral)   Resp 18   Ht 5\' 4"  (1.626 m)   Wt 108.9 kg   SpO2 99%   BMI 41.20 kg/m  General: Alert, oriented, no distress.  Skin: normal turgor, no rashes, warm and dry HEENT: Normocephalic, atraumatic. Pupils equal round and reactive to light; sclera anicteric; extraocular muscles intact;  Nose without nasal septal hypertrophy Mouth/Parynx benign; Mallinpatti scale Neck: No JVD, no carotid bruits; normal carotid upstroke Lungs: clear to ausculatation and percussion; no  wheezing or rales Chest wall: without tenderness to palpitation Heart: PMI not displaced, RRR, s1 s2 normal, 1/6 systolic murmur, no diastolic murmur, no rubs, gallops, thrills, or heaves Abdomen: soft, nontender; no hepatosplenomehaly, BS+; abdominal aorta nontender and not dilated by palpation. Back: no CVA tenderness Pulses 2+ Musculoskeletal: full range of motion, normal strength, no joint deformities Extremities: no clubbing cyanosis or edema, Homan's sign negative  Neurologic: grossly nonfocal; Cranial nerves grossly wnl Psychologic: Normal mood and affect   Labs    Chemistry Recent Labs  Lab 06/09/19 1258 06/10/19 0253  NA  --  141  K  --  4.2  CL  --  106  CO2  --  23  GLUCOSE  --  123*  BUN  --  12  CREATININE 0.68 0.81  CALCIUM  --  9.0  GFRNONAA >60 >60  GFRAA >60 >60  ANIONGAP  --  12     Hematology Recent Labs  Lab 06/09/19 1258 06/10/19 0253  WBC 7.0 6.8  RBC 4.46 4.35  HGB 12.5 12.1  HCT 38.7 37.7  MCV 86.8 86.7  MCH 28.0 27.8  MCHC 32.3 32.1  RDW 13.5 13.5  PLT 230 236    Cardiac EnzymesNo results for input(s): TROPONINI in the last 168 hours. No results for  input(s): TROPIPOC in the last 168 hours.   BNPNo results for input(s): BNP, PROBNP in the last 168 hours.   DDimer No results for input(s): DDIMER in the last 168 hours.   Lipid Panel  No results found for: CHOL, TRIG, HDL, CHOLHDL, VLDL, LDLCALC, LDLDIRECT   Radiology    No results found.  Cardiac Studies    95% proximal RCA treated with 22 x 2.5 mm Onyx postdilated to 4.0 mm in diameter.  TIMI grade III flow but with occlusion of the SA nodal artery.  Bradycardia secondary to SA nodal artery occlusion.  Hopefully this will improve with as single artery eventually opens as the assumption is that there is ostial narrowing due to stent jail.  Widely patent left main  Luminal irregularities in the proximal to mid LAD.  Widely patent, small circumflex and obtuse marginal  territory without significant disease.  Normal LV function.  RECOMMENDATIONS:   Aspirin and Brilinta for 6 months.  High intensity statin therapy.  Monitor heart rhythm.  Beta-blocker therapy is being held due to bradycardia as noted above.     Intervention     Patient Profile     63 y.o. female was followed by Dr. Geraldo Pitter who presented on June 09, 2019 for cardiac catheterization after having undergone an abnormal stress test.  Assessment & Plan    1. CAD: s/p PCI to RCA with 95% stenosis being reduced to 0%.  Procedure was complicated by jailing of the SA nodal artery leading to transient occlusion and development of junctional bradycardia.  I suspect her vessel has now opened.  ECG today shows sinus rhythm in the 60s.  The patient had previously been started on low-dose metoprolol.  We will hold presently.  We will add low-dose amlodipine at 5 mg with her history of essential hypertension.  Continue DAPT for minimum of 6 months.  2.  Hyperlipidemia: Target LDL less than 70.  He was started on atorvastatin 80 mg  3.  Essential hypertension: We will initiate amlodipine 5 mg.  At present we will hold beta-blocker therapy.  4.  Mild obesity: Weight loss and exercise recommended.  Plan discharged today with follow-up Dr. Geraldo Pitter  Signed, Troy Sine, MD, [facility] 06/10/2019, 12:01 PM

## 2019-06-10 NOTE — Discharge Summary (Addendum)
Discharge Summary    Patient ID: Amber Sexton MRN: [id number]; DOB: 09-26-1955  Admit date: 06/09/2019 Discharge date: 06/10/2019  Primary Care Provider: [facility], [place] [facility]  Primary Cardiologist: Jenean Lindau, MD  Primary Electrophysiologist:  None   Discharge Diagnoses    Principal Problem:   Angina pectoris [facility]) Active Problems:   Essential hypertension   Dyspnea on exertion   Sleep apnea   Abnormal nuclear stress test   CAD (coronary artery disease)   Allergies Allergies  Allergen Reactions   Ampicillin Anaphylaxis    Diagnostic Studies/Procedures     95% proximal RCA treated with 22 x 2.5 mm Onyx postdilated to 4.0 mm in diameter. TIMI grade III flow but with occlusion of the SA nodal artery.  Bradycardia secondary to SA nodal artery occlusion. Hopefully this will improve with as single artery eventually opens as the assumption is that there is ostial narrowing due to stent jail.  Widely patent left main  Luminal irregularities in the proximal to mid LAD.  Widely patent, small circumflex and obtuse marginal territory without significant disease.  Normal LV function.  RECOMMENDATIONS:   Aspirin and Brilinta for 6 months.  High intensity statin therapy.  Monitor heart rhythm.  Beta-blocker therapy is being held due to bradycardia as noted above.     Intervention    _____________   History of Present Illness     Amber Sexton is a 63 y.o. female with angina, HTN, HLD, OSA, was admitted 10/12 for cardiac catheterization after an abnormal stress test.  Hospital Course     Consultants: None  Procedure results are above.  She had single-vessel disease and received a drug-eluting stent to the RCA.  Her SA nodal artery was occluded and there was bradycardia and a junctional rhythm secondary to this.   Her heart rate and rhythm improved overnight, she is currently in sinus rhythm  Her beta-blocker was held because of  the bradycardia.  To help control her blood pressure, she was started on amlodipine 5 mg.  She was started on high intensity statin.  She is to be on aspirin and Brilinta for 6 months. She will get 30 days of Brilinta from the [place] prior to d/c.   On 10/13, she was seen by Dr. Claiborne Billings and all data were reviewed.  No further inpatient work-up was indicated and she is considered stable for discharge, to follow-up as scheduled.   Did the patient have an acute coronary syndrome (MI, NSTEMI, STEMI, etc) this admission?:  No                               Did the patient have a percutaneous coronary intervention (stent / angioplasty)?:  Yes.     Cath/PCI Registry Performance & Quality Measures: 1. Aspirin prescribed? - Yes 2. ADP Receptor Inhibitor (Plavix/Clopidogrel, Brilinta/Ticagrelor or Effient/Prasugrel) prescribed (includes medically managed patients)? - Yes 3. High Intensity Statin (Lipitor 40-80mg  or Crestor 20-40mg ) prescribed? - Yes 4. For EF <40%, was ACEI/ARB prescribed? - Not Applicable (EF >/= [id number]) 5. For EF <40%, Aldosterone Antagonist (Spironolactone or Eplerenone) prescribed? - Not Applicable (EF >/= [id number]) 6. Cardiac Rehab Phase II ordered (Included Medically managed Patients)? - Yes   _____________  Discharge Vitals Blood pressure (!) 156/80, pulse 78, temperature 97.7 F (36.5 C), temperature source Oral, resp. rate 18, height 5\' 4"  (1.626 m), weight 108.9 kg, SpO2 99 %.  [place] Weights   06/09/19 0539  Weight: 108.9 kg    Labs & Radiologic Studies    CBC Recent Labs    06/09/19 1258 06/10/19 0253  WBC 7.0 6.8  HGB 12.5 12.1  HCT 38.7 37.7  MCV 86.8 86.7  PLT 230 [id number]   Basic Metabolic Panel Recent Labs    06/09/19 1258 06/10/19 0253  NA  --  141  K  --  4.2  CL  --  106  CO2  --  23  GLUCOSE  --  123*  BUN  --  12  CREATININE 0.68 0.81  CALCIUM  --  9.0   No results found for: CHOL, HDL, LDLCALC, LDLDIRECT, TRIG, CHOLHDL  _____________  No  results found. Disposition   Pt is being discharged home today in good condition.  Follow-up Plans & Appointments    Follow-up Information    Revankar, Reita Cliche, MD Follow up.   Specialty: Cardiology Why: Keep scheduled appointment. Contact information: [place] [address] [phone]          Discharge Instructions    Amb Referral to Cardiac Rehabilitation   Complete by: As directed    [email or web address]   Diagnosis: Coronary Stents   After initial evaluation and assessments completed: Virtual Based Care may be provided alone or in conjunction with Phase 2 Cardiac Rehab based on patient barriers.: Yes   Diet - low sodium heart healthy   Complete by: As directed    Increase activity slowly   Complete by: As directed       Discharge Medications   Allergies as of 06/10/2019      Reactions   Ampicillin Anaphylaxis      Medication List    TAKE these medications   aspirin EC 81 MG tablet Take 81 mg by mouth daily.   atorvastatin 80 MG tablet Commonly known as: LIPITOR Take 1 tablet (80 mg total) by mouth daily at 6 PM.   Netarsudil-Latanoprost 0.02-0.005 % Soln Place 1 drop into both eyes at bedtime. ROCKLATAN*   nitroGLYCERIN 0.4 MG SL tablet Commonly known as: NITROSTAT Place 1 tablet (0.4 mg total) under the tongue every 5 (five) minutes as needed for chest pain.   ProAir HFA 108 (90 Base) MCG/ACT inhaler Generic drug: albuterol Inhale 2 puffs into the lungs every 4 (four) hours as needed for wheezing or shortness of breath.   ticagrelor 90 MG Tabs tablet Commonly known as: BRILINTA Take 1 tablet (90 mg total) by mouth 2 (two) times daily.          Outstanding Labs/Studies   None  Duration of Discharge Encounter   Greater than 30 minutes including physician time.  Signed, Rosaria Ferries, PA-C 06/10/2019, 12:41 PM

## 2019-06-10 NOTE — Progress Notes (Signed)
CARDIAC REHAB PHASE I   PRE:  Rate/Rhythm: 75 SR  BP:  Sitting: 155/71      SaO2: 99 RA  MODE:  Ambulation: 200 ft   POST:  Rate/Rhythm: 105 ST  BP:  Sitting: 179/82    SaO2: 97 RA  Pt ambulated 247ft in hallway handheld assist with steady gait. Pt c/o some SOB, but states it is improved since stent placement. Pt returned to recliner. Pt educated on importance of ASA, Brilinta, statin, and NTG. Pt given stent card along with heart healthy diet. Reviewed restrictions, site care, and exercise guidelines. Will refer to CRP II [place], pt interested in Virtual Cardiac Rehab.  Pt is interested in participating in Virtual Cardiac and Pulmonary Rehab. Pt advised that Virtual Cardiac and Pulmonary Rehab is provided at no cost to the patient.  Checklist:  1. Pt has smart device  ie smartphone and/or ipad for downloading an app  Yes 2. Reliable internet/wifi service    Yes 3. Understands how to use their smartphone and navigate within an app.  Yes  Pt verbalized understanding and is in agreement.  [id number] Rufina Falco, RN BSN 06/10/2019 9:15 AM

## 2019-06-10 NOTE — Care Management (Signed)
Per Leigh V. With [facility] Co-pay amount for a 30 day or 90 day supply of Brilinta bid $8.95.  No PA required Teir 4 medication Pharmacies are [employer] order)

## 2019-06-10 NOTE — TOC Initial Note (Signed)
Transition of Care [facility]) - Initial/Assessment Note    Patient Details  Name: Amber Sexton MRN: [id number] Date of Birth: 1955-09-19  Transition of [facility]) CM/SW Contact:    Alberteen Sam, [place] Phone Number: [phone] 06/10/2019, 11:17 AM  Clinical Narrative:                  30 day Brilinta card provided to patient, patient informed of $8.95 co pay at [facility]. Patient reports no questions or concerns at this time.   No further discharge needs identified.   Expected Discharge Plan: Home/Self Care Barriers to Discharge: No Barriers Identified   Patient Goals and CMS Choice   CMS Medicare.gov Compare Post Acute Care list provided to:: Patient Choice offered to / list presented to : Patient  Expected Discharge Plan and Services Expected Discharge Plan: Home/Self Care       Living arrangements for the past 2 months: Single Family Home                                      Prior Living Arrangements/Services Living arrangements for the past 2 months: Single Family Home Lives with:: Self Patient language and need for interpreter reviewed:: Yes Do you feel safe going back to the place where you live?: Yes      Need for Family Participation in Patient Care: No (Comment) Care giver support system in place?: Yes (comment)   Criminal Activity/Legal Involvement Pertinent to Current Situation/Hospitalization: No - Comment as needed  Activities of Daily Living Home Assistive Devices/Equipment: None ADL Screening (condition at time of admission) Patient's cognitive ability adequate to safely complete daily activities?: Yes Is the patient deaf or have difficulty hearing?: No Does the patient have difficulty seeing, even when wearing glasses/contacts?: No Does the patient have difficulty concentrating, remembering, or making decisions?: No Patient able to express need for assistance with ADLs?: Yes Does the patient have difficulty dressing or  bathing?: No Independently performs ADLs?: Yes (appropriate for developmental age) Does the patient have difficulty walking or climbing stairs?: No Weakness of Legs: None Weakness of Arms/Hands: None  Permission Sought/Granted                  Emotional Assessment Appearance:: Appears stated age Attitude/Demeanor/Rapport: Gracious Affect (typically observed): Calm Orientation: : Oriented to Self, Oriented to Place, Oriented to  Time, Oriented to Situation Alcohol / Substance Use: Not Applicable Psych Involvement: No (comment)  Admission diagnosis:  CAD (coronary artery disease) [I25.10] Patient Active Problem List   Diagnosis Date Noted  . CAD (coronary artery disease) 06/09/2019  . Abnormal nuclear stress test 06/02/2019  . Angina pectoris ([place]) 06/02/2019  . Dyspnea on exertion 04/28/2019  . Sleep apnea 04/28/2019  . Chest pain 07/21/2015  . Essential hypertension 07/21/2015  . Ex-cigarette smoker 07/21/2015   PCP:  [facility], [place] [facility] Pharmacy:   [place] [place] [address] Phone: [phone] Fax: [phone]     Social Determinants of Health (SDOH) Interventions    Readmission Risk Interventions No flowsheet data found.

## 2019-06-11 ENCOUNTER — Emergency Department (HOSPITAL_COMMUNITY): Payer: Medicare HMO

## 2019-06-11 ENCOUNTER — Other Ambulatory Visit: Payer: Self-pay

## 2019-06-11 ENCOUNTER — Emergency Department (HOSPITAL_COMMUNITY)
Admission: EM | Admit: 2019-06-11 | Discharge: 2019-06-11 | Disposition: A | Discharge status: Home / Self Care | Payer: Medicare HMO | Attending: Emergency Medicine | Admitting: Emergency Medicine

## 2019-06-11 DIAGNOSIS — Z87891 Personal history of nicotine dependence: Secondary | ICD-10-CM | POA: Diagnosis not present

## 2019-06-11 DIAGNOSIS — R0789 Other chest pain: Secondary | ICD-10-CM | POA: Diagnosis present

## 2019-06-11 DIAGNOSIS — R072 Precordial pain: Secondary | ICD-10-CM | POA: Diagnosis not present

## 2019-06-11 DIAGNOSIS — R0602 Shortness of breath: Secondary | ICD-10-CM | POA: Diagnosis not present

## 2019-06-11 DIAGNOSIS — Z9889 Other specified postprocedural states: Secondary | ICD-10-CM | POA: Insufficient documentation

## 2019-06-11 DIAGNOSIS — I10 Essential (primary) hypertension: Secondary | ICD-10-CM | POA: Insufficient documentation

## 2019-06-11 DIAGNOSIS — Z7982 Long term (current) use of aspirin: Secondary | ICD-10-CM | POA: Diagnosis not present

## 2019-06-11 DIAGNOSIS — I2510 Atherosclerotic heart disease of native coronary artery without angina pectoris: Secondary | ICD-10-CM

## 2019-06-11 DIAGNOSIS — J449 Chronic obstructive pulmonary disease, unspecified: Secondary | ICD-10-CM | POA: Insufficient documentation

## 2019-06-11 DIAGNOSIS — R079 Chest pain, unspecified: Secondary | ICD-10-CM

## 2019-06-11 LAB — CBC WITH DIFFERENTIAL/PLATELET
Abs Immature Granulocytes: 0.04 10*3/uL (ref 0.00–0.07)
Basophils Absolute: 0 10*3/uL (ref 0.0–0.1)
Basophils Relative: 0 %
Eosinophils Absolute: 0.1 10*3/uL (ref 0.0–0.5)
Eosinophils Relative: 1 %
HCT: 39.9 % (ref 36.0–46.0)
Hemoglobin: 12.6 g/dL (ref 12.0–15.0)
Immature Granulocytes: 1 %
Lymphocytes Relative: 21 %
Lymphs Abs: 1.7 10*3/uL (ref 0.7–4.0)
MCH: 27.8 pg (ref 26.0–34.0)
MCHC: 31.6 g/dL (ref 30.0–36.0)
MCV: 88.1 fL (ref 80.0–100.0)
Monocytes Absolute: 0.5 10*3/uL (ref 0.1–1.0)
Monocytes Relative: 7 %
Neutro Abs: 5.7 10*3/uL (ref 1.7–7.7)
Neutrophils Relative %: 70 %
Platelets: 243 10*3/uL (ref 150–400)
RBC: 4.53 MIL/uL (ref 3.87–5.11)
RDW: 13.5 % (ref 11.5–15.5)
WBC: 8.1 10*3/uL (ref 4.0–10.5)
nRBC: 0 % (ref 0.0–0.2)

## 2019-06-11 LAB — COMPREHENSIVE METABOLIC PANEL
ALT: 15 U/L (ref 0–44)
AST: 24 U/L (ref 15–41)
Albumin: 3.5 g/dL (ref 3.5–5.0)
Alkaline Phosphatase: 103 U/L (ref 38–126)
Anion gap: 11 (ref 5–15)
BUN: 7 mg/dL — ABNORMAL LOW (ref 8–23)
CO2: 24 mmol/L (ref 22–32)
Calcium: 9.1 mg/dL (ref 8.9–10.3)
Chloride: 105 mmol/L (ref 98–111)
Creatinine, Ser: 0.71 mg/dL (ref 0.44–1.00)
GFR calc Af Amer: >60 mL/min (ref >60)
GFR calc non Af Amer: >60 mL/min (ref >60)
Glucose, Bld: 107 mg/dL — ABNORMAL HIGH (ref 70–99)
Potassium: 3.8 mmol/L (ref 3.5–5.1)
Sodium: 140 mmol/L (ref 135–145)
Total Bilirubin: 0.8 mg/dL (ref 0.3–1.2)
Total Protein: 6.9 g/dL (ref 6.5–8.1)

## 2019-06-11 LAB — TROPONIN I (HIGH SENSITIVITY)
Troponin I (High Sensitivity): 1485 ng/L (ref <18)
Troponin I (High Sensitivity): 1231 ng/L (ref <18)

## 2019-06-11 NOTE — ED Triage Notes (Signed)
C/o chest pain that started this am, had stents placed MOnday-- went home yesterday-- became short of breath this am- with sats dropping to 48% on room air per pt-- states pain is a "pressure"

## 2019-06-11 NOTE — Discharge Instructions (Signed)
Continue medications as previously prescribed. ° °Follow-up with your cardiologist in the next few days, and return to the ER if symptoms significantly worsen or change. °

## 2019-06-11 NOTE — Consult Note (Addendum)
Cardiology Consultation:   Patient ID: Amber Sexton MRN: [id number]; DOB: [birth year]  Admit date: 06/11/2019 Date of Consult: 06/11/2019  Primary Care Provider: [facility] [place] [facility] Primary Cardiologist: Jenean Lindau, MD  Primary Electrophysiologist:  None    Patient Profile:   Amber Sexton is a 63 y.o. female with a hx of CAD s/p PCI/DESx1 (06/09/19), HTN, HL, COPD who is being seen today for the evaluation of chest pain at the request of Dr. Stark Jock.  History of Present Illness:   [name] is a 63 year old female with past medical history noted above.  She presented for outpatient cardiac catheterization on 06/09/2019 after an abnormal stress test.  Underwent single-vessel intervention with PCI/DES to the RCA.  Her SA nodal artery was occluded during procedure and there was bradycardia and a junctional rhythm secondary to this.  Her heart rate and rhythm did improve overnight and remained in sinus rhythm.  Her beta-blocker was held because of her bradycardia.  For better blood pressure control she was started on amlodipine 5 mg.  She was started on high intensity statin, as well as DAPT with aspirin and Brilinta for at least 6 months.   She was discharged 06/10/2019.  States she was feeling well whenever she discharged home.  States this morning she developed centralized chest fullness and shortness of breath.  States she felt sharp pain with deep inspiration.  Was intermittently short of breath at times as well.  States this feels very different than the chest pain she had prior to her catheterization.  Much less intense.   In the ED her labs showed labs showed stable electrolytes, high-sensitivity troponin 1485>> 1231, hemoglobin 12.6.  EKG showed sinus rhythm with no acute ST/T wave abnormalities.  Of note this EKG did not cross into the chart therefore not available for review in epic right now.  Chest x-ray with scarring in the left right colon, no edema and mild  cardiomegaly.  Heart Pathway Score:     Past Medical History:  Diagnosis Date  . Arthritis   . Chest pain 07/21/2015  . COPD (chronic obstructive pulmonary disease) ([place])   . Essential hypertension 07/21/2015  . Ex-cigarette smoker 07/21/2015  . Meniere disease   . Pulmonary hypertension ([place])     Past Surgical History:  Procedure Laterality Date  . ABDOMINAL HYSTERECTOMY    . CORONARY STENT INTERVENTION N/A 06/09/2019   Procedure: CORONARY STENT INTERVENTION;  Surgeon: Belva Crome, MD;  Location: [facility];  Service: Cardiovascular;  Laterality: N/A;  . LEFT HEART CATH AND CORONARY ANGIOGRAPHY N/A 06/09/2019   Procedure: LEFT HEART CATH AND CORONARY ANGIOGRAPHY;  Surgeon: Belva Crome, MD;  Location: [facility];  Service: Cardiovascular;  Laterality: N/A;  . TONSILLECTOMY       Home Medications:  Prior to Admission medications   Medication Sig Start Date End Date Taking? Authorizing Provider  albuterol (PROAIR HFA) 108 (90 Base) MCG/ACT inhaler Inhale 2 puffs into the lungs every 4 (four) hours as needed for wheezing or shortness of breath.  02/01/18  Yes [provider]  aspirin EC 81 MG tablet Take 81 mg by mouth daily.   Yes [provider]  atorvastatin (LIPITOR) 80 MG tablet Take 1 tablet (80 mg total) by mouth daily at 6 PM. 06/10/19  Yes Barrett, Evelene Croon, PA-C  Netarsudil-Latanoprost 0.02-0.005 % SOLN Place 1 drop into both eyes at bedtime. ROCKLATAN*   Yes [provider]  nitroGLYCERIN (NITROSTAT) 0.4 MG  SL tablet Place 1 tablet (0.4 mg total) under the tongue every 5 (five) minutes as needed for chest pain. 06/02/19 08/31/19 Yes Revankar, Reita Cliche, MD  ticagrelor (BRILINTA) 90 MG TABS tablet Take 1 tablet (90 mg total) by mouth 2 (two) times daily. 06/10/19  Yes Barrett, Evelene Croon, PA-C    Inpatient Medications: Scheduled Meds:  Continuous Infusions:  PRN Meds:   Allergies:    Allergies  Allergen Reactions  .  Ampicillin Anaphylaxis    Did it involve swelling of the face/tongue/throat, SOB, or low BP? Yes Did it involve sudden or severe rash/hives, skin peeling, or any reaction on the inside of your mouth or nose? No Did you need to seek medical attention at a hospital or doctor's office? Yes When did it last happen?several years ago If all above answers are "NO", may proceed with cephalosporin use.     Social History:   Social History   Socioeconomic History  . Marital status: Divorced    Spouse name: Not on file  . Number of children: Not on file  . Years of education: Not on file  . Highest education level: Not on file  Occupational History  . Not on file  Social Needs  . Financial resource strain: Not on file  . Food insecurity    Worry: Not on file    Inability: Not on file  . Transportation needs    Medical: Not on file    Non-medical: Not on file  Tobacco Use  . Smoking status: Former Smoker    Packs/day: 1.00    Types: Cigarettes    Quit date: 2014    Years since quitting: 6.7  . Smokeless tobacco: Never Used  Substance and Sexual Activity  . Alcohol use: No  . Drug use: No  . Sexual activity: Not on file  Lifestyle  . Physical activity    Days per week: Not on file    Minutes per session: Not on file  . Stress: Not on file  Relationships  . Social Herbalist on phone: Not on file    Gets together: Not on file    Attends religious service: Not on file    Active member of club or organization: Not on file    Attends meetings of clubs or organizations: Not on file    Relationship status: Not on file  . Intimate partner violence    Fear of current or ex partner: Not on file    Emotionally abused: Not on file    Physically abused: Not on file    Forced sexual activity: Not on file  Other Topics Concern  . Not on file  Social History Narrative  . Not on file    Family History:    Family History  Problem Relation Age of Onset  . Diabetes  Sister   . Breast cancer Sister   . Hypertension Sister   . Diabetes Brother   . Hypertension Brother   . Diabetes Brother   . Colon cancer Brother   . Hypertension Brother      ROS:  Please see the history of present illness.   All other ROS reviewed and negative.     Physical Exam/Data:   Vitals:   06/11/19 0944 06/11/19 1313 06/11/19 1330  BP: (!) 152/86 (!) 153/65 (!) 146/93  Pulse: 83 75 73  Resp: 18 15 17   Temp: 98.3 F (36.8 C)    TempSrc: Oral    SpO2: 100%  100% 99%   No intake or output data in the 24 hours ending 06/11/19 1522 Last 3 Weights 06/09/2019 06/02/2019 05/27/2019  Weight (lbs) 240 lb 240 lb 232 lb  Weight (kg) 108.863 kg 108.863 kg 105.235 kg     There is no height or weight on file to calculate BMI.  General:  Well nourished, obese older white female, in no acute distress HEENT: normal Neck: no JVD Endocrine:  No thryomegaly Vascular: No carotid bruit Cardiac:  normal S1, S2; RRR; no murmur  Lungs:  clear to auscultation bilaterally, no wheezing, rhonchi or rales  Abd: soft, nontender, no hepatomegaly  Ext: no edema Musculoskeletal:  No deformities, BUE and BLE strength normal and equal Skin: warm and dry  Neuro:  CNs 2-12 intact, no focal abnormalities noted Psych:  Normal affect   EKG:  The EKG was personally reviewed and demonstrates: Sinus rhythm rate 81 bpm, no ST/T wave abnormalities.  Relevant CV Studies:  Cath: 06/09/19   95% proximal RCA treated with 22 x 2.5 mm Onyx postdilated to 4.0 mm in diameter.  TIMI grade III flow but with occlusion of the SA nodal artery.  Bradycardia secondary to SA nodal artery occlusion.  Hopefully this will improve with as single artery eventually opens as the assumption is that there is ostial narrowing due to stent jail.  Widely patent left main  Luminal irregularities in the proximal to mid LAD.  Widely patent, small circumflex and obtuse marginal territory without significant disease.   Normal LV function.  RECOMMENDATIONS:   Aspirin and Brilinta for 6 months.  High intensity statin therapy.  Monitor heart rhythm.  Beta-blocker therapy is being held due to bradycardia as noted above.  Diagnostic Dominance: Right  Intervention    TTE: 05/26/19  IMPRESSIONS    1. Left ventricular ejection fraction, by visual estimation, is 55-60%. The left ventricle has normal function. Normal left ventricular size. There is no left ventricular hypertrophy.  2. Left ventricular diastolic Doppler parameters are consistent with pseudonormalization pattern of LV diastolic filling (Grade II Diastolic Dysfunction).  3. Borderline left ventricular global longitudinal strain ( -11.2 %).  4. Global right ventricle has normal systolic function.The right ventricular size is normal. No increase in right ventricular wall thickness.  5. Left atrial size was normal.  6. Right atrial size was normal.  7. Mild aortic valve annular calcification.  8. The aortic valve is normal in structure. Aortic valve regurgitation was not visualized by color flow Doppler. Structurally normal aortic valve, with no evidence of sclerosis or stenosis.  9. The mitral valve is normal in structure. No evidence of mitral valve regurgitation. No evidence of mitral stenosis. 10. The tricuspid valve is normal in structure. Tricuspid valve regurgitation is trivial. 11. The pulmonic valve was normal in structure. Pulmonic valve regurgitation is trivial by color flow Doppler. 12. Mildly elevated pulmonary artery systolic pressure.   Laboratory Data:  High Sensitivity Troponin:   Recent Labs  Lab 06/11/19 1324  TROPONINIHS 1,485*     Chemistry Recent Labs  Lab 06/09/19 1258 06/10/19 0253 06/11/19 1324  NA  --  141 140  K  --  4.2 3.8  CL  --  106 105  CO2  --  23 24  GLUCOSE  --  123* 107*  BUN  --  12 7*  CREATININE 0.68 0.81 0.71  CALCIUM  --  9.0 9.1  GFRNONAA >60 >60 >60  GFRAA >60 >60 >60   ANIONGAP  --  12 11  Recent Labs  Lab 06/11/19 1324  PROT 6.9  ALBUMIN 3.5  AST 24  ALT 15  ALKPHOS 103  BILITOT 0.8   Hematology Recent Labs  Lab 06/09/19 1258 06/10/19 0253 06/11/19 1324  WBC 7.0 6.8 8.1  RBC 4.46 4.35 4.53  HGB 12.5 12.1 12.6  HCT 38.7 37.7 39.9  MCV 86.8 86.7 88.1  MCH 28.0 27.8 27.8  MCHC 32.3 32.1 31.6  RDW 13.5 13.5 13.5  PLT 230 236 243   BNPNo results for input(s): BNP, PROBNP in the last 168 hours.  DDimer No results for input(s): DDIMER in the last 168 hours.   Radiology/Studies:  Dg Chest Port 1 View  Result Date: 06/11/2019 CLINICAL DATA:  Chest pain EXAM: PORTABLE CHEST 1 VIEW COMPARISON:  June 02, 2019 FINDINGS: Lungs are hyperexpanded. There is scarring in the left mid lung. There is no edema or consolidation. Heart is borderline enlarged with pulmonary vascularity normal. No adenopathy. No pneumothorax. No bone lesions. IMPRESSION: Lungs mildly hyperexpanded with scarring in left mid lung. No edema or consolidation. Mild cardiomegaly. No adenopathy. Electronically Signed   By: Lowella Grip III M.D.   On: 06/11/2019 13:46    Assessment and Plan:   Amber Sexton is a 63 y.o. female with a hx of CAD s/p PCI/DESx1 (06/09/19), HTN, HL, COPD who is being seen today for the evaluation of chest pain at the request of Dr. Stark Jock.  1.  Chest discomfort/shortness of breath: States symptoms are mostly related to dyspnea, brief intermittent episodes of chest discomfort, but very vague.  Symptoms are unlike those which prompted her cardiac catheterization.  She was started on DAPT with aspirin and Brilinta post cath.  Suspect her symptoms are related to initiation of Brilinta, and have advised her to use caffeine.  Informed that if her symptoms persist can consider switching to Plavix with a reload.   2.  Elevated troponin: Cath noted a jailed SA nodal artery with occlusion during  PCI/DES to the RCA.  Troponin is elevated, but trending down.   We do not have a troponin post cath to compare.  Suspect this is related to occlusion during cath. She is not having any current symptoms at present.   3. CAD s/p PCI: on DAPT with ASA/Brilinta for at least 6 months. If unable to tolerate, can consider switching to plavix.    For questions or updates, please contact [place] Please consult [email or web address] for contact info under   Signed, Reino Bellis, NP  06/11/2019 3:22 PM   I have examined the patient and reviewed assessment and plan and discussed with patient.  Agree with above as stated.    Shortness of breath that may be related to her COPD or her Brilinta.  Troponin positive but trending down and likely related to transient occlusion of jailed side branch during PCI.  She has felt well walking.    She can try some caffeine.  If SHOB persists by Friday, would switch her to clopidogrel.  She would need a 300 mg loading dose followed by 75 mg daily. Will inform Dr. Geraldo Pitter, who follows her as an outpatient.   Larae Grooms

## 2019-06-11 NOTE — ED Notes (Signed)
Pt d/c home per MD order. Discharge summary reviewed with pt, pt verbalizes understanding. Off unit via WC. Pt family here for d/c ride home.

## 2019-06-11 NOTE — ED Notes (Signed)
Date and time results received: 06/11/19  (use smartphrase ".now" to insert current time)  Test: Troponin Critical Value: 1485  Name of Provider Notified: Delo MD  Orders Received? Or Actions Taken?: Genia Hotter notified MD of critical troponin

## 2019-06-11 NOTE — ED Provider Notes (Addendum)
[facility] DEPARTMENT Provider Note   CSN: [id number] Arrival date & time: 06/11/19  0930     History   Chief Complaint Chief Complaint  Patient presents with  . Chest Pain    HPI Markeyta Goodfellow is a 63 y.o. female.     Patient is a 63 year old female with history of coronary artery disease with stent.  This was placed 2 days ago here at [facility].  Patient successfully discharged, but returns today with complaints of chest discomfort.  She describes a heaviness to the front of her chest with no nausea, diaphoresis, or shortness of breath.  It is worse when she takes a deep breath.  She denies any fevers, chills, or cough.  This feels different than what she experienced with her prior heart pain.  The history is provided by the patient.  Chest Pain Pain location:  Substernal area Pain quality comment:  Heaviness Pain radiates to:  Does not radiate Pain severity:  Moderate Onset quality:  Gradual Duration:  24 hours Timing:  Constant Progression:  Worsening Chronicity:  New Relieved by:  Nothing Worsened by:  Nothing Ineffective treatments:  None tried   Past Medical History:  Diagnosis Date  . Arthritis   . Chest pain 07/21/2015  . COPD (chronic obstructive pulmonary disease) ([place])   . Essential hypertension 07/21/2015  . Ex-cigarette smoker 07/21/2015  . Meniere disease   . Pulmonary hypertension [facility])     Patient Active Problem List   Diagnosis Date Noted  . CAD (coronary artery disease) 06/09/2019  . Abnormal nuclear stress test 06/02/2019  . Angina pectoris ([place]) 06/02/2019  . Dyspnea on exertion 04/28/2019  . Sleep apnea 04/28/2019  . Chest pain 07/21/2015  . Essential hypertension 07/21/2015  . Ex-cigarette smoker 07/21/2015    Past Surgical History:  Procedure Laterality Date  . ABDOMINAL HYSTERECTOMY    . CORONARY STENT INTERVENTION N/A 06/09/2019   Procedure: CORONARY STENT INTERVENTION;  Surgeon: Belva Crome, MD;  Location: [facility];  Service: Cardiovascular;  Laterality: N/A;  . LEFT HEART CATH AND CORONARY ANGIOGRAPHY N/A 06/09/2019   Procedure: LEFT HEART CATH AND CORONARY ANGIOGRAPHY;  Surgeon: Belva Crome, MD;  Location: [facility];  Service: Cardiovascular;  Laterality: N/A;  . TONSILLECTOMY       OB History   No obstetric history on file.      Home Medications    Prior to Admission medications   Medication Sig Start Date End Date Taking? Authorizing Provider  albuterol (PROAIR HFA) 108 (90 Base) MCG/ACT inhaler Inhale 2 puffs into the lungs every 4 (four) hours as needed for wheezing or shortness of breath.  02/01/18   [provider]  aspirin EC 81 MG tablet Take 81 mg by mouth daily.    [provider]  atorvastatin (LIPITOR) 80 MG tablet Take 1 tablet (80 mg total) by mouth daily at 6 PM. 06/10/19   Barrett, Evelene Croon, PA-C  Netarsudil-Latanoprost 0.02-0.005 % SOLN Place 1 drop into both eyes at bedtime. [facility]*    [provider]  nitroGLYCERIN (NITROSTAT) 0.4 MG SL tablet Place 1 tablet (0.4 mg total) under the tongue every 5 (five) minutes as needed for chest pain. 06/02/19 08/31/19  Revankar, Reita Cliche, MD  ticagrelor (BRILINTA) 90 MG TABS tablet Take 1 tablet (90 mg total) by mouth 2 (two) times daily. 06/10/19   Barrett, Evelene Croon, PA-C    Family History Family History  Problem Relation Age  of Onset  . Diabetes Sister   . Breast cancer Sister   . Hypertension Sister   . Diabetes Brother   . Hypertension Brother   . Diabetes Brother   . Colon cancer Brother   . Hypertension Brother     Social History Social History   Tobacco Use  . Smoking status: Former Smoker    Packs/day: 1.00    Types: Cigarettes    Quit date: 2014    Years since quitting: 6.7  . Smokeless tobacco: Never Used  Substance Use Topics  . Alcohol use: No  . Drug use: No     Allergies   Ampicillin   Review of Systems Review of Systems   Cardiovascular: Positive for chest pain.  All other systems reviewed and are negative.    Physical Exam Updated Vital Signs BP (!) 152/86 (BP Location: Left Arm)   Pulse 83   Temp 98.3 F (36.8 C) (Oral)   Resp 18   SpO2 100%   Physical Exam Vitals signs and nursing note reviewed.  Constitutional:      General: She is not in acute distress.    Appearance: She is well-developed. She is not diaphoretic.  HENT:     Head: Normocephalic and atraumatic.  Neck:     Musculoskeletal: Normal range of motion and neck supple.  Cardiovascular:     Rate and Rhythm: Normal rate and regular rhythm.     Heart sounds: No murmur. No friction rub. No gallop.   Pulmonary:     Effort: Pulmonary effort is normal. No respiratory distress.     Breath sounds: Normal breath sounds. No wheezing.  Abdominal:     General: Bowel sounds are normal. There is no distension.     Palpations: Abdomen is soft.     Tenderness: There is no abdominal tenderness.  Musculoskeletal: Normal range of motion.     Right lower leg: She exhibits no tenderness. No edema.     Left lower leg: She exhibits no tenderness. No edema.  Skin:    General: Skin is warm and dry.  Neurological:     Mental Status: She is alert and oriented to person, place, and time.      ED Treatments / Results  Labs (all labs ordered are listed, but only abnormal results are displayed) Labs Reviewed  COMPREHENSIVE METABOLIC PANEL  CBC WITH DIFFERENTIAL/PLATELET  TROPONIN I (HIGH SENSITIVITY)    EKG ED ECG REPORT   Date: 06/11/2019  Rate: 82  Rhythm: normal sinus rhythm  QRS Axis: normal  Intervals: normal  ST/T Wave abnormalities: nonspecific T wave changes  Conduction Disutrbances:none  Narrative Interpretation:   Old EKG Reviewed: unchanged  I have personally reviewed the EKG tracing and agree with the computerized printout as noted.   Radiology No results found.  Procedures Procedures (including critical care time)   Medications Ordered in ED Medications - No data to display   Initial Impression / Assessment and Plan / ED Course  I have reviewed the triage vital signs and the nursing notes.  Pertinent labs & imaging results that were available during my care of the patient were reviewed by me and considered in my medical decision making (see chart for details).  Patient presents here with complaints of chest discomfort.  This began earlier this morning.  Patient 2 days ago underwent stenting here at [facility].  Patient's EKG is unchanged, however troponin has come back significantly elevated at 1500.  This was discussed with cardiology who  will see and evaluate her, and determine the final disposition.  Patient seen by cardiology who feels as though the patient is appropriate for discharge.  Her troponin is downtrending and thought to be related to her recent catheterization.  Patient feeling better.  She is to follow-up with her cardiologist in the near future and return if she worsens.  Final Clinical Impressions(s) / ED Diagnoses   Final diagnoses:  None    ED Discharge Orders    None       Veryl Speak, MD 06/11/19 1452    Veryl Speak, MD 06/11/19 1625

## 2019-06-11 NOTE — ED Notes (Signed)
[id number] would like to get updates

## 2019-06-11 NOTE — ED Notes (Signed)
Visitor at bedside.

## 2019-06-12 ENCOUNTER — Telehealth: Payer: Self-pay | Admitting: Cardiology

## 2019-06-12 NOTE — Telephone Encounter (Signed)
Patient just had cath on Monday and she is taking Brilinta snd it is causing stinging in her chest, she went back to ER yesterday and they state it could be meds.. please call her.

## 2019-06-13 NOTE — Telephone Encounter (Signed)
It is better that she continue Brilinta.  I would switch her medication to Plavix with loading only if absolutely necessary.  Please let her know.

## 2019-06-16 NOTE — Telephone Encounter (Signed)
Left message for patient to call back for updates.

## 2019-06-17 ENCOUNTER — Other Ambulatory Visit: Payer: Self-pay

## 2019-06-17 ENCOUNTER — Encounter (HOSPITAL_COMMUNITY)
Admission: RE | Admit: 2019-06-17 | Discharge: 2019-06-17 | Disposition: A | Discharge status: Home / Self Care | Payer: Self-pay | Source: Ambulatory Visit | Attending: Cardiology | Admitting: Cardiology

## 2019-06-17 DIAGNOSIS — I25119 Atherosclerotic heart disease of native coronary artery with unspecified angina pectoris: Secondary | ICD-10-CM | POA: Insufficient documentation

## 2019-06-23 ENCOUNTER — Telehealth (HOSPITAL_COMMUNITY): Payer: Self-pay

## 2019-06-23 ENCOUNTER — Other Ambulatory Visit: Payer: Self-pay

## 2019-06-23 ENCOUNTER — Encounter (HOSPITAL_COMMUNITY)
Admission: RE | Admit: 2019-06-23 | Discharge: 2019-06-23 | Disposition: A | Discharge status: Home / Self Care | Payer: Self-pay | Source: Ambulatory Visit | Attending: Cardiology | Admitting: Cardiology

## 2019-06-23 DIAGNOSIS — I25119 Atherosclerotic heart disease of native coronary artery with unspecified angina pectoris: Secondary | ICD-10-CM

## 2019-06-23 NOTE — Telephone Encounter (Signed)
Unsuccessful telephone encounter to Ms. Amber Sexton to discuss symptoms of shortness of breath logged during virtual CR. Left HIPAA compliant message with patients daughter requesting return call to [place] at [phone]. Jamiel Goncalves E. Laray Anger, BSN

## 2019-06-23 NOTE — Progress Notes (Signed)
Incoming call from Ms. Amber Sexton as requested by [facility] RN. Ms. Amber Sexton has logged shortness of breath during exercise several days last week during virtual CR. Ms. Amber Sexton states her shortness of breath is a result of her antiplatelet medication and deconditioning as she lives a sedentary lifestyle and attempting to begin an exercise regimen. She denies any s/s of chest discomfort or shortness of breath that prevents her from completing ADLs. She is instructed to notify her cardiologist if chest pain, pressure, or worsening shortness of breath occur. Ms. Amber Sexton verbalizes understanding. Patient congratulated on her efforts to modify her lifestyle to prevent worsening CAD. Will continue to monitor patient via CR Virtual App.  Amber Sexton E. Laray Anger, BSN

## 2019-07-04 ENCOUNTER — Ambulatory Visit (INDEPENDENT_AMBULATORY_CARE_PROVIDER_SITE_OTHER): Payer: Medicare HMO | Admitting: Cardiology

## 2019-07-04 ENCOUNTER — Other Ambulatory Visit: Payer: Self-pay

## 2019-07-04 ENCOUNTER — Encounter: Payer: Self-pay | Admitting: Cardiology

## 2019-07-04 VITALS — BP 130/96 | HR 81 | Ht 64.0 in | Wt 235.0 lb

## 2019-07-04 DIAGNOSIS — Z87891 Personal history of nicotine dependence: Secondary | ICD-10-CM | POA: Diagnosis not present

## 2019-07-04 DIAGNOSIS — Z1329 Encounter for screening for other suspected endocrine disorder: Secondary | ICD-10-CM

## 2019-07-04 DIAGNOSIS — I2510 Atherosclerotic heart disease of native coronary artery without angina pectoris: Secondary | ICD-10-CM | POA: Diagnosis not present

## 2019-07-04 NOTE — Progress Notes (Signed)
Cardiology Office Note:    Date:  07/04/2019   ID:  Amber Sexton, DOB 10/13/1955, MRN [id number]  PCP:  Care, [place] Better  Cardiologist:  Jenean Lindau, MD   Referring MD: Care, [place]:    No diagnosis found. PLAN:    In order of problems listed above:  1. Coronary artery disease: Secondary prevention stressed with the patient.  Importance of compliance with diet and medication stressed and he vocalized understanding. 2. Essential hypertension: Blood pressure stable 3. Mixed dyslipidemia obesity: Diet was discussed risks explained she vocalized understanding.  She will be back in 6 weeks for liver lipid check in 4 months and follow-up appointment.  I discussed cardiac rehab but she is not keen on it and wants to exercise at home in a graded fashion.  I respect her wishes.   Medication Adjustments/Labs and Tests Ordered: Current medicines are reviewed at length with the patient today.  Concerns regarding medicines are outlined above.  No orders of the defined types were placed in this encounter.  No orders of the defined types were placed in this encounter.    Chief Complaint  Patient presents with  . Follow-up     History of Present Illness:    Amber Sexton is a 63 y.o. female.  Patient has past medical history of essential hypertension dyslipidemia and morbid obesity.  She underwent coronary angiography and right coronary artery stenting and the details are mentioned below.  She had some issues with shortness of breath with Brilinta but she tells me she is getting better.  Her daughter is accompanying her for this visit and is very supportive.  At the time of my evaluation, the patient is alert awake oriented and in no distress.   Past Medical History:  Diagnosis Date  . Arthritis   . Chest pain 07/21/2015  . COPD (chronic obstructive pulmonary disease) ([place])   . Essential hypertension 07/21/2015  . Ex-cigarette smoker  07/21/2015  . Meniere disease   . Pulmonary hypertension ([place])     Past Surgical History:  Procedure Laterality Date  . ABDOMINAL HYSTERECTOMY    . CORONARY STENT INTERVENTION N/A 06/09/2019   Procedure: CORONARY STENT INTERVENTION;  Surgeon: Belva Crome, MD;  Location: [place] [facility];  Service: Cardiovascular;  Laterality: N/A;  . LEFT HEART CATH AND CORONARY ANGIOGRAPHY N/A 06/09/2019   Procedure: LEFT HEART CATH AND CORONARY ANGIOGRAPHY;  Surgeon: Belva Crome, MD;  Location: [place] [facility];  Service: Cardiovascular;  Laterality: N/A;  . TONSILLECTOMY      Current Medications: Current Meds  Medication Sig  . albuterol (PROAIR HFA) 108 (90 Base) MCG/ACT inhaler Inhale 2 puffs into the lungs every 4 (four) hours as needed for wheezing or shortness of breath.   Marland Kitchen aspirin EC 81 MG tablet Take 81 mg by mouth daily.  Marland Kitchen atorvastatin (LIPITOR) 80 MG tablet Take 1 tablet (80 mg total) by mouth daily at 6 PM.  . Netarsudil-Latanoprost 0.02-0.005 % SOLN Place 1 drop into both eyes at bedtime. ROCKLATAN*  . nitroGLYCERIN (NITROSTAT) 0.4 MG SL tablet Place 1 tablet (0.4 mg total) under the tongue every 5 (five) minutes as needed for chest pain.  . ticagrelor (BRILINTA) 90 MG TABS tablet Take 1 tablet (90 mg total) by mouth 2 (two) times daily.     Allergies:   Ampicillin   Social History   Socioeconomic History  . Marital status: Divorced    Spouse name:  Not on file  . Number of children: Not on file  . Years of education: Not on file  . Highest education level: Not on file  Occupational History  . Not on file  Social Needs  . Financial resource strain: Not on file  . Food insecurity    Worry: Not on file    Inability: Not on file  . Transportation needs    Medical: Not on file    Non-medical: Not on file  Tobacco Use  . Smoking status: Former Smoker    Packs/day: 1.00    Types: Cigarettes    Quit date: 2014    Years since quitting: 6.8  . Smokeless tobacco:  Never Used  Substance and Sexual Activity  . Alcohol use: No  . Drug use: No  . Sexual activity: Not on file  Lifestyle  . Physical activity    Days per week: Not on file    Minutes per session: Not on file  . Stress: Not on file  Relationships  . Social Herbalist on phone: Not on file    Gets together: Not on file    Attends religious service: Not on file    Active member of club or organization: Not on file    Attends meetings of clubs or organizations: Not on file    Relationship status: Not on file  Other Topics Concern  . Not on file  Social History Narrative  . Not on file     Family History: The patient's family history includes Breast cancer in her sister; Colon cancer in her brother; Diabetes in her brother, brother, and sister; Hypertension in her brother, brother, and sister.  ROS:   Please see the history of present illness.    All other systems reviewed and are negative.  EKGs/Labs/Other Studies Reviewed:    The following studies were reviewed today: Belva Crome, MD (Primary)    Procedures  CORONARY STENT INTERVENTION  LEFT HEART CATH AND CORONARY ANGIOGRAPHY  Conclusion   95% proximal RCA treated with 22 x 2.5 mm Onyx postdilated to 4.0 mm in diameter.  TIMI grade III flow but with occlusion of the SA nodal artery.  Bradycardia secondary to SA nodal artery occlusion.  Hopefully this will improve with as single artery eventually opens as the assumption is that there is ostial narrowing due to stent jail.  Widely patent left main  Luminal irregularities in the proximal to mid LAD.  Widely patent, small circumflex and obtuse marginal territory without significant disease.  Normal LV function.  RECOMMENDATIONS:   Aspirin and Brilinta for 6 months.  High intensity statin therapy.  Monitor heart rhythm.  Beta-blocker therapy is being held due to bradycardia as noted above.      Recent Labs: 06/11/2019: ALT 15; BUN 7;  Creatinine, Ser 0.71; Hemoglobin 12.6; Platelets 243; Potassium 3.8; Sodium 140  Recent Lipid Panel No results found for: CHOL, TRIG, HDL, CHOLHDL, VLDL, LDLCALC, LDLDIRECT  Physical Exam:    VS:  BP (!) 130/96 (BP Location: Left Arm, Patient Position: Sitting, Cuff Size: Large)   Pulse 81   Ht 5\' 4"  (1.626 m)   Wt 235 lb (106.6 kg)   SpO2 98%   BMI 40.34 kg/m     Wt Readings from Last 3 Encounters:  07/04/19 235 lb (106.6 kg)  06/09/19 240 lb (108.9 kg)  06/02/19 240 lb (108.9 kg)     GEN: Patient is in no acute distress HEENT: Normal NECK: No  JVD; No carotid bruits LYMPHATICS: No lymphadenopathy CARDIAC: Hear sounds regular, 2/6 systolic murmur at the apex. RESPIRATORY:  Clear to auscultation without rales, wheezing or rhonchi  ABDOMEN: Soft, non-tender, non-distended MUSCULOSKELETAL:  No edema; No deformity  SKIN: Warm and dry NEUROLOGIC:  Alert and oriented x 3 PSYCHIATRIC:  Normal affect   Signed, Jenean Lindau, MD  07/04/2019 1:28 PM    [facility]

## 2019-07-04 NOTE — Patient Instructions (Signed)
Medication Instructions:  Your physician recommends that you continue on your current medications as directed. Please refer to the Current Medication list given to you today.  *If you need a refill on your cardiac medications before your next appointment, please call your pharmacy*  Lab Work: Your physician recommends that you return FASTING in 6 weeks for BMP, TSH and lipid to be drawn  If you have labs (blood work) drawn today and your tests are completely normal, you will receive your results only by: Marland Kitchen MyChart Message (if you have MyChart) OR . A paper copy in the mail If you have any lab test that is abnormal or we need to change your treatment, we will call you to review the results.  Testing/Procedures: NONE  Follow-Up: At [facility], you and your health needs are our priority.  As part of our continuing mission to provide you with exceptional heart care, we have created designated Provider Care Teams.  These Care Teams include your primary Cardiologist (physician) and Advanced Practice Providers (APPs -  Physician Assistants and Nurse Practitioners) who all work together to provide you with the care you need, when you need it.  Your next appointment:   4 months  The format for your next appointment:   In Person  Provider:   Jyl Heinz, MD

## 2019-07-14 DIAGNOSIS — I2510 Atherosclerotic heart disease of native coronary artery without angina pectoris: Secondary | ICD-10-CM | POA: Diagnosis not present

## 2019-07-14 DIAGNOSIS — Z1329 Encounter for screening for other suspected endocrine disorder: Secondary | ICD-10-CM | POA: Diagnosis not present

## 2019-07-14 DIAGNOSIS — H401132 Primary open-angle glaucoma, bilateral, moderate stage: Secondary | ICD-10-CM | POA: Diagnosis not present

## 2019-07-14 LAB — BASIC METABOLIC PANEL
BUN/Creatinine Ratio: 21 (ref 12–28)
BUN: 15 mg/dL (ref 8–27)
CO2: 23 mmol/L (ref 20–29)
Calcium: 9.3 mg/dL (ref 8.7–10.3)
Chloride: 102 mmol/L (ref 96–106)
Creatinine, Ser: 0.72 mg/dL (ref 0.57–1.00)
GFR calc Af Amer: 104 mL/min/{1.73_m2} (ref >59)
GFR calc non Af Amer: 90 mL/min/{1.73_m2} (ref >59)
Glucose: 104 mg/dL — ABNORMAL HIGH (ref 65–99)
Potassium: 4 mmol/L (ref 3.5–5.2)
Sodium: 141 mmol/L (ref 134–144)

## 2019-07-14 LAB — LIPID PANEL
Chol/HDL Ratio: 2.2 ratio (ref 0.0–4.4)
Cholesterol, Total: 141 mg/dL (ref 100–199)
HDL: 63 mg/dL (ref >39)
LDL Chol Calc (NIH): 61 mg/dL (ref 0–99)
Triglycerides: 90 mg/dL (ref 0–149)
VLDL Cholesterol Cal: 17 mg/dL (ref 5–40)

## 2019-07-14 LAB — TSH: TSH: 2.6 u[IU]/mL (ref 0.450–4.500)

## 2019-07-31 ENCOUNTER — Telehealth: Payer: Self-pay

## 2019-07-31 NOTE — Telephone Encounter (Signed)
-----   Message from Jenean Lindau, MD sent at 07/14/2019  4:43 PM EST ----- The results of the study is unremarkable. Please inform patient. I will discuss in detail at next appointment. Cc  primary care/referring physician Jenean Lindau, MD 07/14/2019 4:43 PM

## 2019-07-31 NOTE — Telephone Encounter (Signed)
Left message for patient to call office for results, copy sent to [place].

## 2019-09-01 DIAGNOSIS — Z20822 Contact with and (suspected) exposure to covid-19: Secondary | ICD-10-CM | POA: Diagnosis not present

## 2019-09-01 DIAGNOSIS — Z20828 Contact with and (suspected) exposure to other viral communicable diseases: Secondary | ICD-10-CM | POA: Diagnosis not present

## 2019-09-02 ENCOUNTER — Telehealth: Payer: Self-pay | Admitting: Cardiology

## 2019-09-02 DIAGNOSIS — R9439 Abnormal result of other cardiovascular function study: Secondary | ICD-10-CM

## 2019-09-02 DIAGNOSIS — I10 Essential (primary) hypertension: Secondary | ICD-10-CM

## 2019-09-02 NOTE — Telephone Encounter (Signed)
Patient is having serious diarhea after taking the Lipitor and daughter states that her PCP would like to have it changed due to that reason, please call to [facility].

## 2019-09-04 MED ORDER — ROSUVASTATIN CALCIUM 40 MG PO TABS: 40.0000 mg | ORAL_TABLET | Freq: Every day | ORAL | 3 refills | Status: DC

## 2019-09-04 NOTE — Addendum Note (Signed)
Addended by: Beckey Rutter on: 09/04/2019 12:24 PM   Modules accepted: Orders

## 2019-09-04 NOTE — Telephone Encounter (Signed)
Let us try rosuvastatin at half the dose of what she was taking Lipitor.  Liver lipid check in 6 weeks.

## 2019-09-04 NOTE — Telephone Encounter (Signed)
Patient ok with switching to rosuvastatin 40 mg (1 tablet) once daily. Repeat labs ordered. No further questions.

## 2019-11-03 ENCOUNTER — Encounter: Payer: Self-pay | Admitting: Cardiology

## 2019-11-03 ENCOUNTER — Other Ambulatory Visit: Payer: Self-pay

## 2019-11-03 ENCOUNTER — Ambulatory Visit: Payer: Medicare HMO | Admitting: Cardiology

## 2019-11-03 VITALS — BP 118/84 | HR 98 | Ht 64.0 in | Wt 243.0 lb

## 2019-11-03 DIAGNOSIS — I2510 Atherosclerotic heart disease of native coronary artery without angina pectoris: Secondary | ICD-10-CM | POA: Diagnosis not present

## 2019-11-03 DIAGNOSIS — Z87891 Personal history of nicotine dependence: Secondary | ICD-10-CM | POA: Diagnosis not present

## 2019-11-03 DIAGNOSIS — I209 Angina pectoris, unspecified: Secondary | ICD-10-CM | POA: Diagnosis not present

## 2019-11-03 DIAGNOSIS — R0600 Dyspnea, unspecified: Secondary | ICD-10-CM

## 2019-11-03 DIAGNOSIS — I10 Essential (primary) hypertension: Secondary | ICD-10-CM | POA: Diagnosis not present

## 2019-11-03 DIAGNOSIS — R0609 Other forms of dyspnea: Secondary | ICD-10-CM

## 2019-11-03 HISTORY — DX: Morbid (severe) obesity due to excess calories: E66.01

## 2019-11-03 MED ORDER — LANSOPRAZOLE 30 MG PO CPDR: 30.0000 mg | DELAYED_RELEASE_CAPSULE | Freq: Two times a day (BID) | ORAL | 3 refills | Status: DC

## 2019-11-03 NOTE — Patient Instructions (Addendum)
Medication Instructions:  Your physician has recommended you make the following change in your medication:   Start Prevacid 30 mg twice daily.  *If you need a refill on your cardiac medications before your next appointment, please call your pharmacy*   Lab Work: None ordered If you have labs (blood work) drawn today and your tests are completely normal, you will receive your results only by: Marland Kitchen MyChart Message (if you have MyChart) OR . A paper copy in the mail If you have any lab test that is abnormal or we need to change your treatment, we will call you to review the results.   Testing/Procedures: Your physician has requested that you have an echocardiogram. Echocardiography is a painless test that uses sound waves to create images of your heart. It provides your doctor with information about the size and shape of your heart and how well your heart's chambers and valves are working. This procedure takes approximately one hour. There are no restrictions for this procedure.  Your physician has requested that you have a lexiscan myoview. For further information please visit [email or web address]. Please follow instruction sheet, as given.  The test will take approximately 3 to 4 hours to complete; you may bring reading material.  If someone comes with you to your appointment, they will need to remain in the main lobby due to limited space in the testing area. **If you are pregnant or breastfeeding, please notify the nuclear lab prior to your appointment**  How to prepare for your Myocardial Perfusion Test: . Do not eat or drink 3 hours prior to your test, except you may have water. . Do not consume products containing caffeine (regular or decaffeinated) 12 hours prior to your test. (ex: coffee, chocolate, sodas, tea). . Do bring a list of your current medications with you.  If not listed below, you may take your medications as normal. . Do wear comfortable clothes (no dresses or overalls) and  walking shoes, tennis shoes preferred (No heels or open toe shoes are allowed). . Do NOT wear cologne, perfume, aftershave, or lotions (deodorant is allowed). . If these instructions are not followed, your test will have to be rescheduled.    Follow-Up: At [facility], you and your health needs are our priority.  As part of our continuing mission to provide you with exceptional heart care, we have created designated Provider Care Teams.  These Care Teams include your primary Cardiologist (physician) and Advanced Practice Providers (APPs -  Physician Assistants and Nurse Practitioners) who all work together to provide you with the care you need, when you need it.  We recommend signing up for the patient portal called "MyChart".  Sign up information is provided on this After Visit Summary.  MyChart is used to connect with patients for Virtual Visits (Telemedicine).  Patients are able to view lab/test results, encounter notes, upcoming appointments, etc.  Non-urgent messages can be sent to your provider as well.   To learn more about what you can do with MyChart, go to [email or web address].    Your next appointment:   1 month(s)  The format for your next appointment:   In Person  Provider:   Jyl Heinz, MD   Other Instructions NA

## 2019-11-03 NOTE — Progress Notes (Signed)
Cardiology Office Note:    Date:  11/03/2019   ID:  Amber Sexton, DOB 1955/10/05, MRN [id number]  PCP:  Care, [place] Better  Cardiologist:  Jenean Lindau, MD   Referring MD: Care, [place]:    1. Coronary artery disease involving native coronary artery of native heart without angina pectoris   2. Essential hypertension   3. Angina pectoris ([place])   4. Ex-cigarette smoker   5. Dyspnea on exertion   6. Morbid obesity ([place])    PLAN:    In order of problems listed above:  1. Angina pectoris: Her symptoms are concerning to me.  They may reflect the fact that she might have obstructive coronary artery disease again.  Also in view of the fact that she is on 2 antiplatelet agents I will give her proton pump inhibitor.  We will put her in for a Lexiscan sestamibi to rule out any objective evidence of coronary artery disease.  She has bilateral pedal edema but will refrain from giving diuretic at this time because her blood pressure is borderline.  She understands.  This testing will also help me understand her shortness of breath on exertion. 2. Mixed dyslipidemia: Diet was discussed and weight reduction was stressed and she promises to do better. 3. Morbid obesity: Diet was discussed and risks of obesity explained to her.  She is going to be meticulous with diet and do better to lose weight. 4. Follow-up appointment in a month or earlier if she has any concerns.  She knows to go to the nearest emergency room for any concerning symptoms.   Medication Adjustments/Labs and Tests Ordered: Current medicines are reviewed at length with the patient today.  Concerns regarding medicines are outlined above.  No orders of the defined types were placed in this encounter.  No orders of the defined types were placed in this encounter.    Chief Complaint  Patient presents with  . Follow-up    4 Months     History of Present Illness:    Amber Sexton  is a 64 y.o. female.  Patient has past medical history of essential hypertension, dyslipidemia, ex-smoker and she mentions to me that she has burning sensation in her chest.  She has significant shortness of breath on exertion and this is getting worse.  She mentions to me that during coronary angiography and stenting she was experiencing a burning sensation in the chest and this happens when she exerts herself.  At the time of my evaluation, the patient is alert awake oriented and in no distress.  Past Medical History:  Diagnosis Date  . Arthritis   . Chest pain 07/21/2015  . COPD (chronic obstructive pulmonary disease) ([place])   . Essential hypertension 07/21/2015  . Ex-cigarette smoker 07/21/2015  . Meniere disease   . Pulmonary hypertension ([place])     Past Surgical History:  Procedure Laterality Date  . ABDOMINAL HYSTERECTOMY    . CORONARY STENT INTERVENTION N/A 06/09/2019   Procedure: CORONARY STENT INTERVENTION;  Surgeon: Belva Crome, MD;  Location: [place] [facility];  Service: Cardiovascular;  Laterality: N/A;  . LEFT HEART CATH AND CORONARY ANGIOGRAPHY N/A 06/09/2019   Procedure: LEFT HEART CATH AND CORONARY ANGIOGRAPHY;  Surgeon: Belva Crome, MD;  Location: [place] [facility];  Service: Cardiovascular;  Laterality: N/A;  . TONSILLECTOMY      Current Medications: Current Meds  Medication Sig  . albuterol (PROAIR HFA) 108 (90 Base) MCG/ACT  inhaler Inhale 2 puffs into the lungs every 4 (four) hours as needed for wheezing or shortness of breath.   Marland Kitchen aspirin EC 81 MG tablet Take 81 mg by mouth daily.  . Netarsudil-Latanoprost 0.02-0.005 % SOLN Place 1 drop into both eyes at bedtime. ROCKLATAN*  . nitroGLYCERIN (NITROSTAT) 0.4 MG SL tablet Place 1 tablet (0.4 mg total) under the tongue every 5 (five) minutes as needed for chest pain.  . rosuvastatin (CRESTOR) 40 MG tablet Take 1 tablet (40 mg total) by mouth daily.  . ticagrelor (BRILINTA) 90 MG TABS tablet Take 1 tablet (90 mg  total) by mouth 2 (two) times daily.     Allergies:   Ampicillin   Social History   Socioeconomic History  . Marital status: Divorced    Spouse name: Not on file  . Number of children: Not on file  . Years of education: Not on file  . Highest education level: Not on file  Occupational History  . Not on file  Tobacco Use  . Smoking status: Former Smoker    Packs/day: 1.00    Types: Cigarettes    Quit date: 2014    Years since quitting: 7.1  . Smokeless tobacco: Never Used  Substance and Sexual Activity  . Alcohol use: No  . Drug use: No  . Sexual activity: Not on file  Other Topics Concern  . Not on file  Social History Narrative  . Not on file   Social Determinants of Health   Financial Resource Strain:   . Difficulty of Paying Living Expenses: Not on file  Food Insecurity:   . Worried About Charity fundraiser in the Last Year: Not on file  . Ran Out of Food in the Last Year: Not on file  Transportation Needs:   . Lack of Transportation (Medical): Not on file  . Lack of Transportation (Non-Medical): Not on file  Physical Activity:   . Days of Exercise per Week: Not on file  . Minutes of Exercise per Session: Not on file  Stress:   . Feeling of Stress : Not on file  Social Connections:   . Frequency of Communication with Friends and Family: Not on file  . Frequency of Social Gatherings with Friends and Family: Not on file  . Attends Religious Services: Not on file  . Active Member of Clubs or Organizations: Not on file  . Attends Archivist Meetings: Not on file  . Marital Status: Not on file     Family History: The patient's family history includes Breast cancer in her sister; Colon cancer in her brother; Diabetes in her brother, brother, and sister; Hypertension in her brother, brother, and sister.  ROS:   Please see the history of present illness.    All other systems reviewed and are negative.  EKGs/Labs/Other Studies Reviewed:    The  following studies were reviewed today: Belva Crome, MD Study date: 06/09/19  MyChart Results Release  MyChart Status: Active Results Release  Physicians  Panel Physicians Referring Physician Case Authorizing Physician  Belva Crome, MD (Primary)    Procedures  CORONARY STENT INTERVENTION  LEFT HEART CATH AND CORONARY ANGIOGRAPHY  Conclusion   95% proximal RCA treated with 22 x 2.5 mm Onyx postdilated to 4.0 mm in diameter.  TIMI grade III flow but with occlusion of the SA nodal artery.  Bradycardia secondary to SA nodal artery occlusion.  Hopefully this will improve with as single artery eventually opens as the assumption  is that there is ostial narrowing due to stent jail.  Widely patent left main  Luminal irregularities in the proximal to mid LAD.  Widely patent, small circumflex and obtuse marginal territory without significant disease.  Normal LV function.  RECOMMENDATIONS:   Aspirin and Brilinta for 6 months.  High intensity statin therapy.  Monitor heart rhythm.  Beta-blocker therapy is being held due to bradycardia as noted above.      Recent Labs: 06/11/2019: ALT 15; Hemoglobin 12.6; Platelets 243 07/14/2019: BUN 15; Creatinine, Ser 0.72; Potassium 4.0; Sodium 141; TSH 2.600  Recent Lipid Panel    Component Value Date/Time   CHOL 141 07/14/2019 1029   TRIG 90 07/14/2019 1029   HDL 63 07/14/2019 1029   CHOLHDL 2.2 07/14/2019 1029   LDLCALC 61 07/14/2019 1029    Physical Exam:    VS:  BP 118/84   Pulse 98   Ht 5\' 4"  (1.626 m)   Wt 243 lb (110.2 kg)   SpO2 95%   BMI 41.71 kg/m     Wt Readings from Last 3 Encounters:  11/03/19 243 lb (110.2 kg)  07/04/19 235 lb (106.6 kg)  06/09/19 240 lb (108.9 kg)     GEN: Patient is in no acute distress HEENT: Normal NECK: No JVD; No carotid bruits LYMPHATICS: No lymphadenopathy CARDIAC: Hear sounds regular, 2/6 systolic murmur at the apex. RESPIRATORY:  Clear to auscultation without rales,  wheezing or rhonchi  ABDOMEN: Soft, non-tender, non-distended MUSCULOSKELETAL: Bilateral 2+ edema; No deformity  SKIN: Warm and dry NEUROLOGIC:  Alert and oriented x 3 PSYCHIATRIC:  Normal affect   Signed, Jenean Lindau, MD  11/03/2019 3:54 PM    [place]

## 2019-11-19 ENCOUNTER — Telehealth (HOSPITAL_COMMUNITY): Payer: Self-pay | Admitting: *Deleted

## 2019-11-19 NOTE — Telephone Encounter (Signed)
Patient given detailed instructions per Myocardial Perfusion Study Information Sheet for the test on 11/26/19 at 8:15. Patient notified to arrive 15 minutes early and that it is imperative to arrive on time for appointment to keep from having the test rescheduled.  If you need to cancel or reschedule your appointment, please call the office within 24 hours of your appointment. . Patient verbalized understanding.Amber Sexton

## 2019-11-26 ENCOUNTER — Ambulatory Visit (INDEPENDENT_AMBULATORY_CARE_PROVIDER_SITE_OTHER): Payer: Medicare HMO

## 2019-11-26 ENCOUNTER — Other Ambulatory Visit: Payer: Self-pay

## 2019-11-26 VITALS — Ht 64.0 in | Wt 243.0 lb

## 2019-11-26 DIAGNOSIS — R079 Chest pain, unspecified: Secondary | ICD-10-CM

## 2019-11-26 DIAGNOSIS — I2510 Atherosclerotic heart disease of native coronary artery without angina pectoris: Secondary | ICD-10-CM | POA: Diagnosis not present

## 2019-11-26 DIAGNOSIS — I209 Angina pectoris, unspecified: Secondary | ICD-10-CM | POA: Diagnosis not present

## 2019-11-26 MED ORDER — TECHNETIUM TC 99M TETROFOSMIN IV KIT
31.9000 | PACK | Freq: Once | INTRAVENOUS | Status: AC | PRN
  Administered 2019-11-26: 31.9 via INTRAVENOUS

## 2019-11-26 MED ORDER — REGADENOSON 0.4 MG/5ML IV SOLN
0.4000 mg | Freq: Once | INTRAVENOUS | Status: AC
  Administered 2019-11-26: 0.4 mg via INTRAVENOUS

## 2019-11-27 ENCOUNTER — Ambulatory Visit: Payer: Medicare HMO

## 2019-11-27 LAB — MYOCARDIAL PERFUSION IMAGING
LV dias vol: 68 mL (ref 46–106)
LV sys vol: 27 mL
Peak HR: 111 {beats}/min
Rest HR: 92 {beats}/min
SDS: 1
SRS: 2
SSS: 3
TID: 0.94

## 2019-11-27 MED ORDER — TECHNETIUM TC 99M TETROFOSMIN IV KIT
29.1000 | PACK | Freq: Once | INTRAVENOUS | Status: AC | PRN
  Administered 2019-11-27: 29.1 via INTRAVENOUS

## 2019-12-12 ENCOUNTER — Other Ambulatory Visit: Payer: Self-pay

## 2019-12-12 ENCOUNTER — Ambulatory Visit (INDEPENDENT_AMBULATORY_CARE_PROVIDER_SITE_OTHER): Payer: Medicare HMO

## 2019-12-12 DIAGNOSIS — R0600 Dyspnea, unspecified: Secondary | ICD-10-CM | POA: Diagnosis not present

## 2019-12-12 NOTE — Progress Notes (Unsigned)
Complete echocardiogram has been performed.  Jimmy Egidio Lofgren RDCS, RVT 

## 2019-12-15 ENCOUNTER — Encounter: Payer: Self-pay | Admitting: Cardiology

## 2019-12-15 ENCOUNTER — Ambulatory Visit: Payer: Medicare HMO | Admitting: Cardiology

## 2019-12-15 ENCOUNTER — Other Ambulatory Visit: Payer: Self-pay

## 2019-12-15 VITALS — BP 136/104 | HR 91 | Temp 97.3°F | Ht 65.0 in | Wt 242.0 lb

## 2019-12-15 DIAGNOSIS — Z87891 Personal history of nicotine dependence: Secondary | ICD-10-CM | POA: Diagnosis not present

## 2019-12-15 DIAGNOSIS — I10 Essential (primary) hypertension: Secondary | ICD-10-CM

## 2019-12-15 DIAGNOSIS — I2510 Atherosclerotic heart disease of native coronary artery without angina pectoris: Secondary | ICD-10-CM | POA: Diagnosis not present

## 2019-12-15 MED ORDER — FUROSEMIDE 20 MG PO TABS: 20.0000 mg | ORAL_TABLET | Freq: Every day | ORAL | 3 refills | Status: DC

## 2019-12-15 NOTE — Progress Notes (Signed)
Cardiology Office Note:    Date:  12/15/2019   ID:  Amber Sexton, DOB [birth year], MRN [id number]  PCP:  Care, [place] [facility]  Cardiologist:  Jenean Lindau, MD   Referring MD: Care, [place]:    1. Coronary artery disease involving native coronary artery of native heart without angina pectoris   2. Essential hypertension   3. Ex-cigarette smoker   4. Morbid obesity ([place])    PLAN:    In order of problems listed above:  1. Coronary artery disease: Secondary prevention stressed with the patient.  Importance of compliance with diet and medication stressed and she vocalized understanding.  Importance of regular walking and weight reduction stressed. 2. Essential hypertension: Blood pressure is elevated.  Salt intake issues and diet was emphasized 3. Bilateral pedal edema: Echocardiogram is fine.  I restarted her on Lasix 20 mg daily and she will be back in 1 week for blood work including fasting lipids. 4. Mixed dyslipidemia: Diet was discussed lipids were reviewed.  They are significantly elevated. 5. Morbid obesity: Diet was emphasized weight reduction was stressed.  Risks of obesity explained she promises to comply 6. Patient will be seen in follow-up appointment in 3 months or earlier if the patient has any concerns    Medication Adjustments/Labs and Tests Ordered: Current medicines are reviewed at length with the patient today.  Concerns regarding medicines are outlined above.  No orders of the defined types were placed in this encounter.  No orders of the defined types were placed in this encounter.    No chief complaint on file.    History of Present Illness:    Amber Sexton is a 64 y.o. female.  Patient has past medical history of coronary artery disease essential hypertension dyslipidemia and obesity.  She underwent coronary intervention.  She was evaluated recently for significant symptoms and fortunately echocardiogram and  stress testing were unremarkable.  She is happy now with the results of these tests.  She mentions to me that she has bilateral pedal edema and that was because her diuretic was stopped in the past.  At the time of my evaluation, the patient is alert awake oriented and in no distress.  Her family member accompanies her for this visit.  Past Medical History:  Diagnosis Date  . Arthritis   . Chest pain 07/21/2015  . COPD (chronic obstructive pulmonary disease) ([place])   . Essential hypertension 07/21/2015  . Ex-cigarette smoker 07/21/2015  . Meniere disease   . Pulmonary hypertension ([place])     Past Surgical History:  Procedure Laterality Date  . ABDOMINAL HYSTERECTOMY    . CORONARY STENT INTERVENTION N/A 06/09/2019   Procedure: CORONARY STENT INTERVENTION;  Surgeon: Belva Crome, MD;  Location: [facility];  Service: Cardiovascular;  Laterality: N/A;  . LEFT HEART CATH AND CORONARY ANGIOGRAPHY N/A 06/09/2019   Procedure: LEFT HEART CATH AND CORONARY ANGIOGRAPHY;  Surgeon: Belva Crome, MD;  Location: [facility];  Service: Cardiovascular;  Laterality: N/A;  . TONSILLECTOMY      Current Medications: Current Meds  Medication Sig  . albuterol (PROAIR HFA) 108 (90 Base) MCG/ACT inhaler Inhale 2 puffs into the lungs every 4 (four) hours as needed for wheezing or shortness of breath.   Marland Kitchen aspirin EC 81 MG tablet Take 81 mg by mouth daily.  . lansoprazole (PREVACID) 30 MG capsule Take 1 capsule (30 mg total) by mouth 2 (two) times daily before a  meal.  . Netarsudil-Latanoprost 0.02-0.005 % SOLN Place 1 drop into both eyes at bedtime. ROCKLATAN*     Allergies:   Ampicillin   Social History   Socioeconomic History  . Marital status: Divorced    Spouse name: Not on file  . Number of children: Not on file  . Years of education: Not on file  . Highest education level: Not on file  Occupational History  . Not on file  Tobacco Use  . Smoking status: Former Smoker     Packs/day: 1.00    Types: Cigarettes    Quit date: 2014    Years since quitting: 7.3  . Smokeless tobacco: Never Used  Substance and Sexual Activity  . Alcohol use: No  . Drug use: No  . Sexual activity: Not on file  Other Topics Concern  . Not on file  Social History Narrative  . Not on file   Social Determinants of Health   Financial Resource Strain:   . Difficulty of Paying Living Expenses:   Food Insecurity:   . Worried About Charity fundraiser in the Last Year:   . Arboriculturist in the Last Year:   Transportation Needs:   . Film/video editor (Medical):   Marland Kitchen Lack of Transportation (Non-Medical):   Physical Activity:   . Days of Exercise per Week:   . Minutes of Exercise per Session:   Stress:   . Feeling of Stress :   Social Connections:   . Frequency of Communication with Friends and Family:   . Frequency of Social Gatherings with Friends and Family:   . Attends Religious Services:   . Active Member of Clubs or Organizations:   . Attends Archivist Meetings:   Marland Kitchen Marital Status:      Family History: The patient's family history includes Breast cancer in her sister; Colon cancer in her brother; Diabetes in her brother, brother, and sister; Hypertension in her brother, brother, and sister.  ROS:   Please see the history of present illness.    All other systems reviewed and are negative.  EKGs/Labs/Other Studies Reviewed:    The following studies were reviewed today: Study Highlights   Nuclear stress EF: 60%.  There was no ST segment deviation noted during stress.  This is a low risk study.  The left ventricular ejection fraction is normal (55-65%).  No evidence of ischemia or MI.  Normal EF.     IMPRESSIONS    1. Left ventricular ejection fraction, by estimation, is 60 to 65%. The  left ventricle has normal function. The left ventricle has no regional  wall motion abnormalities. There is mild left ventricular hypertrophy.  Left  ventricular diastolic parameters  are consistent with Grade II diastolic dysfunction (pseudonormalization).  2. Right ventricular systolic function is normal. The right ventricular  size is normal. There is normal pulmonary artery systolic pressure.  3. The mitral valve is normal in structure. No evidence of mitral valve  regurgitation. No evidence of mitral stenosis.  4. The aortic valve was not well visualized. Aortic valve regurgitation  is not visualized. No aortic stenosis is present.  5. The inferior vena cava is normal in size with greater than 50%  respiratory variability, suggesting right atrial pressure of 3 mmHg.    Recent Labs: 06/11/2019: ALT 15; Hemoglobin 12.6; Platelets 243 07/14/2019: BUN 15; Creatinine, Ser 0.72; Potassium 4.0; Sodium 141; TSH 2.600  Recent Lipid Panel    Component Value Date/Time   CHOL  141 07/14/2019 1029   TRIG 90 07/14/2019 1029   HDL 63 07/14/2019 1029   CHOLHDL 2.2 07/14/2019 1029   LDLCALC 61 07/14/2019 1029    Physical Exam:    VS:  BP (!) 136/104   Pulse 91   Temp (!) 97.3 F (36.3 C)   Ht 5\' 5"  (1.651 m)   Wt 242 lb (109.8 kg)   SpO2 98%   BMI 40.27 kg/m     Wt Readings from Last 3 Encounters:  12/15/19 242 lb (109.8 kg)  11/26/19 243 lb (110.2 kg)  11/03/19 243 lb (110.2 kg)     GEN: Patient is in no acute distress HEENT: Normal NECK: No JVD; No carotid bruits LYMPHATICS: No lymphadenopathy CARDIAC: Hear sounds regular, 2/6 systolic murmur at the apex. RESPIRATORY:  Clear to auscultation without rales, wheezing or rhonchi  ABDOMEN: Soft, non-tender, non-distended MUSCULOSKELETAL:  No edema; No deformity  SKIN: Warm and dry NEUROLOGIC:  Alert and oriented x 3 PSYCHIATRIC:  Normal affect   Signed, Jenean Lindau, MD  12/15/2019 3:42 PM    [facility]

## 2019-12-15 NOTE — Patient Instructions (Signed)
Medication Instructions:  Your physician has recommended you make the following change in your medication:   Start Lasix 20 mg daily.  *If you need a refill on your cardiac medications before your next appointment, please call your pharmacy*   Lab Work: Your physician recommends that you return for lab work in: 1 week You need to have labs done when you are fasting.  You can come Monday through Friday 8:30 am to 12:00 pm and 1:15 to 4:30. You do not need to make an appointment as the order has already been placed. The labs you are going to have done are BMET, CBC, TSH, LFT and Lipids.   If you have labs (blood work) drawn today and your tests are completely normal, you will receive your results only by: Marland Kitchen MyChart Message (if you have MyChart) OR . A paper copy in the mail If you have any lab test that is abnormal or we need to change your treatment, we will call you to review the results.   Testing/Procedures: None ordered   Follow-Up: At [facility], you and your health needs are our priority.  As part of our continuing mission to provide you with exceptional heart care, we have created designated Provider Care Teams.  These Care Teams include your primary Cardiologist (physician) and Advanced Practice Providers (APPs -  Physician Assistants and Nurse Practitioners) who all work together to provide you with the care you need, when you need it.  We recommend signing up for the patient portal called "MyChart".  Sign up information is provided on this After Visit Summary.  MyChart is used to connect with patients for Virtual Visits (Telemedicine).  Patients are able to view lab/test results, encounter notes, upcoming appointments, etc.  Non-urgent messages can be sent to your provider as well.   To learn more about what you can do with MyChart, go to [email or web address].    Your next appointment:   3 month(s)  The format for your next appointment:   In Person  Provider:    Jyl Heinz, MD   Other Instructions Furosemide Oral Tablets What is this medicine? FUROSEMIDE (fyoor [place] se mide) is a diuretic. It helps you make more urine and to lose salt and excess water from your body. It treats swelling from heart, kidney, or liver disease. It also treats high blood pressure. This medicine may be used for other purposes; ask your health care provider or pharmacist if you have questions. COMMON BRAND NAME(S): Active-Medicated Specimen Kit, Delone, Diuscreen, Lasix, RX Specimen Collection Kit, Specimen Collection Kit, URINX Medicated Specimen Collection What should I tell my health care provider before I take this medicine? They need to know if you have any of these conditions:  abnormal blood electrolytes  diarrhea or vomiting  gout  heart disease  kidney disease, small amounts of urine, or difficulty passing urine  liver disease  thyroid disease  an unusual or allergic reaction to furosemide, sulfa drugs, other medicines, foods, dyes, or preservatives  pregnant or trying to get pregnant  breast-feeding How should I use this medicine? Take this drug by mouth. Take it as directed on the prescription label at the same time every day. You can take it with or without food. If it upsets your stomach, take it with food. Keep taking it unless your health care provider tells you to stop. Talk to your health care provider about the use of this drug in children. Special care may be needed. Overdosage: If you think you have  taken too much of this medicine contact a poison control center or emergency room at once. NOTE: This medicine is only for you. Do not share this medicine with others. What if I miss a dose? If you miss a dose, take it as soon as you can. If it is almost time for your next dose, take only that dose. Do not take double or extra doses. What may interact with this medicine?  aspirin and aspirin-like medicines  certain antibiotics  chloral  hydrate  cisplatin  cyclosporine  digoxin  diuretics  laxatives  lithium  medicines for blood pressure  medicines that relax muscles for surgery  methotrexate  NSAIDs, medicines for pain and inflammation like ibuprofen, naproxen, or indomethacin  phenytoin  steroid medicines like prednisone or cortisone  sucralfate  thyroid hormones This list may not describe all possible interactions. Give your health care provider a list of all the medicines, herbs, non-prescription drugs, or dietary supplements you use. Also tell them if you smoke, drink alcohol, or use illegal drugs. Some items may interact with your medicine. What should I watch for while using this medicine? Visit your doctor or health care provider for regular checks on your progress. Check your blood pressure regularly. Ask your doctor or health care provider what your blood pressure should be, and when you should contact him or her. If you are a diabetic, check your blood sugar as directed. This medicine may cause serious skin reactions. They can happen weeks to months after starting the medicine. Contact your health care provider right away if you notice fevers or flu-like symptoms with a rash. The rash may be red or purple and then turn into blisters or peeling of the skin. Or, you might notice a red rash with swelling of the face, lips or lymph nodes in your neck or under your arms. You may need to be on a special diet while taking this medicine. Check with your doctor. Also, ask how many glasses of fluid you need to drink a day. You must not get dehydrated. You may get drowsy or dizzy. Do not drive, use machinery, or do anything that needs mental alertness until you know how this drug affects you. Do not stand or sit up quickly, especially if you are an older patient. This reduces the risk of dizzy or fainting spells. Alcohol can make you more drowsy and dizzy. Avoid alcoholic drinks. This medicine can make you more  sensitive to the sun. Keep out of the sun. If you cannot avoid being in the sun, wear protective clothing and use sunscreen. Do not use sun lamps or tanning beds/booths. What side effects may I notice from receiving this medicine? Side effects that you should report to your doctor or health care professional as soon as possible:  blood in urine or stools  dry mouth  fever or chills  hearing loss or ringing in the ears  irregular heartbeat  muscle pain or weakness, cramps  rash, fever, and swollen lymph nodes  redness, blistering, peeling or loosening of the skin, including inside the mouth  skin rash  stomach upset, pain, or nausea  tingling or numbness in the hands or feet  unusually weak or tired  vomiting or diarrhea  yellowing of the eyes or skin Side effects that usually do not require medical attention (report to your doctor or health care professional if they continue or are bothersome):  headache  loss of appetite  unusual bleeding or bruising This list may not describe  all possible side effects. Call your doctor for medical advice about side effects. You may report side effects to FDA at 1-800-FDA-1088. Where should I keep my medicine? Keep out of the reach of children and pets. Store at room temperature between 20 and 25 degrees C (68 and 77 degrees F). Protect from light and moisture. Keep the container tightly closed. Throw away any unused drug after the expiration date. NOTE: This sheet is a summary. It may not cover all possible information. If you have questions about this medicine, talk to your doctor, pharmacist, or health care provider.  2020 Elsevier/Gold Standard (2019-04-01 18:01:32)

## 2019-12-16 LAB — CBC WITH DIFFERENTIAL/PLATELET
Basophils Absolute: 0.1 10*3/uL (ref 0.0–0.2)
Basos: 1 %
EOS (ABSOLUTE): 0.1 10*3/uL (ref 0.0–0.4)
Eos: 2 %
Hematocrit: 41 % (ref 34.0–46.6)
Hemoglobin: 13.3 g/dL (ref 11.1–15.9)
Immature Grans (Abs): 0 10*3/uL (ref 0.0–0.1)
Immature Granulocytes: 1 %
Lymphocytes Absolute: 2.1 10*3/uL (ref 0.7–3.1)
Lymphs: 27 %
MCH: 27.8 pg (ref 26.6–33.0)
MCHC: 32.4 g/dL (ref 31.5–35.7)
MCV: 86 fL (ref 79–97)
Monocytes Absolute: 0.5 10*3/uL (ref 0.1–0.9)
Monocytes: 6 %
Neutrophils Absolute: 5 10*3/uL (ref 1.4–7.0)
Neutrophils: 63 %
Platelets: 253 10*3/uL (ref 150–450)
RBC: 4.78 x10E6/uL (ref 3.77–5.28)
RDW: 13 % (ref 11.7–15.4)
WBC: 7.8 10*3/uL (ref 3.4–10.8)

## 2019-12-16 LAB — HEPATIC FUNCTION PANEL
ALT: 18 IU/L (ref 0–32)
AST: 15 IU/L (ref 0–40)
Albumin: 4.3 g/dL (ref 3.8–4.8)
Alkaline Phosphatase: 156 IU/L — ABNORMAL HIGH (ref 39–117)
Bilirubin Total: 0.5 mg/dL (ref 0.0–1.2)
Bilirubin, Direct: 0.16 mg/dL (ref 0.00–0.40)
Total Protein: 7.3 g/dL (ref 6.0–8.5)

## 2019-12-16 LAB — BASIC METABOLIC PANEL
BUN/Creatinine Ratio: 13 (ref 12–28)
BUN: 9 mg/dL (ref 8–27)
CO2: 24 mmol/L (ref 20–29)
Calcium: 9.7 mg/dL (ref 8.7–10.3)
Chloride: 102 mmol/L (ref 96–106)
Creatinine, Ser: 0.67 mg/dL (ref 0.57–1.00)
GFR calc Af Amer: 109 mL/min/{1.73_m2} (ref >59)
GFR calc non Af Amer: 95 mL/min/{1.73_m2} (ref >59)
Glucose: 97 mg/dL (ref 65–99)
Potassium: 3.8 mmol/L (ref 3.5–5.2)
Sodium: 140 mmol/L (ref 134–144)

## 2019-12-16 LAB — LIPID PANEL
Chol/HDL Ratio: 2.2 ratio (ref 0.0–4.4)
Cholesterol, Total: 145 mg/dL (ref 100–199)
HDL: 67 mg/dL (ref >39)
LDL Chol Calc (NIH): 59 mg/dL (ref 0–99)
Triglycerides: 106 mg/dL (ref 0–149)
VLDL Cholesterol Cal: 19 mg/dL (ref 5–40)

## 2019-12-16 LAB — TSH: TSH: 3.66 u[IU]/mL (ref 0.450–4.500)

## 2020-01-02 ENCOUNTER — Other Ambulatory Visit: Payer: Self-pay | Admitting: Cardiology

## 2020-01-05 ENCOUNTER — Telehealth: Payer: Self-pay | Admitting: Cardiology

## 2020-01-05 MED ORDER — CLOPIDOGREL BISULFATE 75 MG PO TABS: ORAL_TABLET | ORAL | 3 refills | Status: DC

## 2020-01-05 MED ORDER — LANSOPRAZOLE 30 MG PO CPDR: 30.0000 mg | DELAYED_RELEASE_CAPSULE | Freq: Two times a day (BID) | ORAL | 3 refills | Status: DC

## 2020-01-05 NOTE — Telephone Encounter (Signed)
New message  Pt c/o medication issue:  1. Name of Medication:  ticagrelor (BRILINTA) 90 MG TABS tablet lansoprazole (PREVACID) 30 MG capsule 2. How are you currently taking this medication (dosage and times per day)? As directed  3. Are you having a reaction (difficulty breathing--STAT)? No  4. What is your medication issue? Patient states that she has questions about the medications and would like to speak with the nurse to assist. Please give patient a call back to assist.

## 2020-01-05 NOTE — Telephone Encounter (Signed)
My preference is for her to be on Plavix after she finishes her Brilinta.  Make sure there is no interruption.  Plavix 75 mg daily and will discuss more at next appointment.

## 2020-01-05 NOTE — Telephone Encounter (Signed)
Pt will complete the rest of her Brilinta and will transition to Plavix. Pt is aware that she will take her last dose of Brilinta at night and 12 hours later she will take 600 mg Plavix. Pt will then start taking 75 mg Plavix daily.

## 2020-01-05 NOTE — Telephone Encounter (Signed)
Pt states that she was told that she needed to be on the Brilinta for 6 months and that it had been 6 months. Pt was on Brilinta prior to 06/10/19. How do you advise?

## 2020-01-05 NOTE — Addendum Note (Signed)
Addended by: Truddie Hidden on: 01/05/2020 03:03 PM   Modules accepted: Orders

## 2020-01-09 DIAGNOSIS — H401133 Primary open-angle glaucoma, bilateral, severe stage: Secondary | ICD-10-CM | POA: Diagnosis not present

## 2020-03-12 DIAGNOSIS — H401131 Primary open-angle glaucoma, bilateral, mild stage: Secondary | ICD-10-CM | POA: Diagnosis not present

## 2020-04-06 DIAGNOSIS — R600 Localized edema: Secondary | ICD-10-CM | POA: Diagnosis not present

## 2020-04-06 DIAGNOSIS — I10 Essential (primary) hypertension: Secondary | ICD-10-CM | POA: Diagnosis not present

## 2020-04-06 DIAGNOSIS — Z79899 Other long term (current) drug therapy: Secondary | ICD-10-CM | POA: Diagnosis not present

## 2020-04-06 DIAGNOSIS — E785 Hyperlipidemia, unspecified: Secondary | ICD-10-CM | POA: Diagnosis not present

## 2020-04-06 DIAGNOSIS — I2510 Atherosclerotic heart disease of native coronary artery without angina pectoris: Secondary | ICD-10-CM | POA: Diagnosis not present

## 2020-04-06 DIAGNOSIS — Z6839 Body mass index (BMI) 39.0-39.9, adult: Secondary | ICD-10-CM | POA: Diagnosis not present

## 2020-04-06 DIAGNOSIS — J449 Chronic obstructive pulmonary disease, unspecified: Secondary | ICD-10-CM | POA: Diagnosis not present

## 2020-04-06 DIAGNOSIS — H401133 Primary open-angle glaucoma, bilateral, severe stage: Secondary | ICD-10-CM | POA: Diagnosis not present

## 2020-04-28 ENCOUNTER — Telehealth: Payer: Self-pay | Admitting: Family

## 2020-04-28 ENCOUNTER — Other Ambulatory Visit: Payer: Self-pay | Admitting: Unknown Physician Specialty

## 2020-04-28 ENCOUNTER — Telehealth: Payer: Self-pay | Admitting: Unknown Physician Specialty

## 2020-04-28 DIAGNOSIS — I10 Essential (primary) hypertension: Secondary | ICD-10-CM

## 2020-04-28 NOTE — Telephone Encounter (Signed)
I connected by phone with daughter of Amber Sexton on 04/28/2020 at 5:07 PM to discuss the potential use of a new treatment for mild to moderate COVID-19 viral infection in non-hospitalized patients.  This patient is a 64 y.o. female that meets the FDA criteria for Emergency Use Authorization of COVID monoclonal antibody casirivimab/imdevimab.  Has a (+) direct SARS-CoV-2 viral test result  Has mild or moderate COVID-19   Is NOT hospitalized due to COVID-19  Is within 10 days of symptom onset  Has at least one of the high risk factor(s) for progression to severe COVID-19 and/or hospitalization as defined in EUA.  Specific high risk criteria : BMI > 25   I have spoken and communicated the following to the patient or parent/caregiver regarding COVID monoclonal antibody treatment:  1. FDA has authorized the emergency use for the treatment of mild to moderate COVID-19 in adults and pediatric patients with positive results of direct SARS-CoV-2 viral testing who are 68 years of age and older weighing at least 40 kg, and who are at high risk for progressing to severe COVID-19 and/or hospitalization.  2. The significant known and potential risks and benefits of COVID monoclonal antibody, and the extent to which such potential risks and benefits are unknown.  3. Information on available alternative treatments and the risks and benefits of those alternatives, including clinical trials.  4. Patients treated with COVID monoclonal antibody should continue to self-isolate and use infection control measures (e.g., wear mask, isolate, social distance, avoid sharing personal items, clean and disinfect "high touch" surfaces, and frequent handwashing) according to CDC guidelines.   5. The patient or parent/caregiver has the option to accept or refuse COVID monoclonal antibody treatment.  After reviewing this information with the patient's daughter, The patient agreed to proceed with receiving  casirivimab\imdevimab infusion and will be provided a copy of the Fact sheet prior to receiving the infusion. Kathrine Haddock 04/28/2020 5:07 PM  sx onset 8/26

## 2020-04-28 NOTE — Telephone Encounter (Signed)
Called to Discuss with patient about Covid symptoms and the use of the monoclonal antibody infusion for those with mild to moderate Covid symptoms and at a high risk of hospitalization.     Pt appears to qualify for this infusion due to co-morbid conditions and/or a member of an at-risk group in accordance with the FDA Emergency Use Authorization.   I was unable to connect with Ms. Kitch. Appears to qualify with risk factors being  CAD, hypertension and obesity.   Message left on machine and MyChart message sent.   Terri Piedra, NP 04/28/2020 3:22 PM

## 2020-04-29 ENCOUNTER — Ambulatory Visit (HOSPITAL_COMMUNITY): Payer: Medicare HMO

## 2020-05-28 DIAGNOSIS — H401131 Primary open-angle glaucoma, bilateral, mild stage: Secondary | ICD-10-CM | POA: Diagnosis not present

## 2020-06-02 DIAGNOSIS — J439 Emphysema, unspecified: Secondary | ICD-10-CM | POA: Diagnosis not present

## 2020-06-02 DIAGNOSIS — Z8616 Personal history of COVID-19: Secondary | ICD-10-CM | POA: Diagnosis not present

## 2020-06-02 DIAGNOSIS — R0981 Nasal congestion: Secondary | ICD-10-CM | POA: Diagnosis not present

## 2020-06-02 DIAGNOSIS — E785 Hyperlipidemia, unspecified: Secondary | ICD-10-CM | POA: Diagnosis not present

## 2020-06-02 DIAGNOSIS — R0602 Shortness of breath: Secondary | ICD-10-CM | POA: Diagnosis not present

## 2020-06-02 DIAGNOSIS — K219 Gastro-esophageal reflux disease without esophagitis: Secondary | ICD-10-CM | POA: Diagnosis not present

## 2020-06-02 DIAGNOSIS — J0140 Acute pansinusitis, unspecified: Secondary | ICD-10-CM | POA: Diagnosis not present

## 2020-06-02 DIAGNOSIS — R053 Chronic cough: Secondary | ICD-10-CM | POA: Diagnosis not present

## 2020-06-02 DIAGNOSIS — I2510 Atherosclerotic heart disease of native coronary artery without angina pectoris: Secondary | ICD-10-CM | POA: Diagnosis not present

## 2020-06-02 DIAGNOSIS — H409 Unspecified glaucoma: Secondary | ICD-10-CM | POA: Diagnosis not present

## 2020-06-23 DIAGNOSIS — R0602 Shortness of breath: Secondary | ICD-10-CM | POA: Diagnosis not present

## 2020-06-23 DIAGNOSIS — J0140 Acute pansinusitis, unspecified: Secondary | ICD-10-CM | POA: Diagnosis not present

## 2020-06-23 DIAGNOSIS — J029 Acute pharyngitis, unspecified: Secondary | ICD-10-CM | POA: Diagnosis not present

## 2020-06-23 DIAGNOSIS — Z1159 Encounter for screening for other viral diseases: Secondary | ICD-10-CM | POA: Diagnosis not present

## 2020-06-23 DIAGNOSIS — R5383 Other fatigue: Secondary | ICD-10-CM | POA: Diagnosis not present

## 2020-06-23 DIAGNOSIS — R0981 Nasal congestion: Secondary | ICD-10-CM | POA: Diagnosis not present

## 2020-06-23 DIAGNOSIS — R52 Pain, unspecified: Secondary | ICD-10-CM | POA: Diagnosis not present

## 2020-06-23 DIAGNOSIS — R519 Headache, unspecified: Secondary | ICD-10-CM | POA: Diagnosis not present

## 2020-07-13 DIAGNOSIS — H353132 Nonexudative age-related macular degeneration, bilateral, intermediate dry stage: Secondary | ICD-10-CM | POA: Diagnosis not present

## 2020-07-13 DIAGNOSIS — H401133 Primary open-angle glaucoma, bilateral, severe stage: Secondary | ICD-10-CM | POA: Diagnosis not present

## 2020-07-19 ENCOUNTER — Other Ambulatory Visit: Payer: Self-pay

## 2020-07-19 DIAGNOSIS — I272 Pulmonary hypertension, unspecified: Secondary | ICD-10-CM | POA: Insufficient documentation

## 2020-07-19 DIAGNOSIS — J449 Chronic obstructive pulmonary disease, unspecified: Secondary | ICD-10-CM | POA: Insufficient documentation

## 2020-07-19 DIAGNOSIS — M1990 Unspecified osteoarthritis, unspecified site: Secondary | ICD-10-CM | POA: Insufficient documentation

## 2020-07-19 DIAGNOSIS — H8109 Meniere's disease, unspecified ear: Secondary | ICD-10-CM | POA: Insufficient documentation

## 2020-07-20 ENCOUNTER — Encounter: Payer: Self-pay | Admitting: Cardiology

## 2020-07-20 ENCOUNTER — Ambulatory Visit (INDEPENDENT_AMBULATORY_CARE_PROVIDER_SITE_OTHER): Payer: Medicare HMO | Admitting: Cardiology

## 2020-07-20 ENCOUNTER — Other Ambulatory Visit: Payer: Self-pay

## 2020-07-20 VITALS — BP 152/86 | HR 85 | Ht 65.0 in | Wt 215.6 lb

## 2020-07-20 DIAGNOSIS — E669 Obesity, unspecified: Secondary | ICD-10-CM

## 2020-07-20 DIAGNOSIS — I2510 Atherosclerotic heart disease of native coronary artery without angina pectoris: Secondary | ICD-10-CM

## 2020-07-20 DIAGNOSIS — Z87891 Personal history of nicotine dependence: Secondary | ICD-10-CM

## 2020-07-20 DIAGNOSIS — I10 Essential (primary) hypertension: Secondary | ICD-10-CM | POA: Diagnosis not present

## 2020-07-20 HISTORY — DX: Obesity, unspecified: E66.9

## 2020-07-20 LAB — HEPATIC FUNCTION PANEL
ALT: 14 IU/L (ref 0–32)
AST: 13 IU/L (ref 0–40)
Albumin: 4.1 g/dL (ref 3.8–4.8)
Alkaline Phosphatase: 118 IU/L (ref 44–121)
Bilirubin Total: 0.3 mg/dL (ref 0.0–1.2)
Bilirubin, Direct: 0.12 mg/dL (ref 0.00–0.40)
Total Protein: 6.8 g/dL (ref 6.0–8.5)

## 2020-07-20 LAB — LIPID PANEL
Chol/HDL Ratio: 2.5 ratio (ref 0.0–4.4)
Cholesterol, Total: 140 mg/dL (ref 100–199)
HDL: 56 mg/dL (ref >39)
LDL Chol Calc (NIH): 68 mg/dL (ref 0–99)
Triglycerides: 84 mg/dL (ref 0–149)
VLDL Cholesterol Cal: 16 mg/dL (ref 5–40)

## 2020-07-20 LAB — BASIC METABOLIC PANEL
BUN/Creatinine Ratio: 15 (ref 12–28)
BUN: 9 mg/dL (ref 8–27)
CO2: 27 mmol/L (ref 20–29)
Calcium: 9.4 mg/dL (ref 8.7–10.3)
Chloride: 101 mmol/L (ref 96–106)
Creatinine, Ser: 0.6 mg/dL (ref 0.57–1.00)
GFR calc Af Amer: 112 mL/min/{1.73_m2} (ref >59)
GFR calc non Af Amer: 97 mL/min/{1.73_m2} (ref >59)
Glucose: 111 mg/dL — ABNORMAL HIGH (ref 65–99)
Potassium: 3.8 mmol/L (ref 3.5–5.2)
Sodium: 139 mmol/L (ref 134–144)

## 2020-07-20 NOTE — Progress Notes (Signed)
Cardiology Office Note:    Date:  07/20/2020   ID:  Amber Sexton, DOB [birth year], MRN [id number]  PCP:  Care, [place] [facility]  Cardiologist:  Jenean Lindau, MD   Referring MD: Care, [place]:    1. Essential hypertension   2. Coronary artery disease involving native coronary artery of native heart without angina pectoris   3. Ex-cigarette smoker   4. Obesity (BMI 35.0-39.9 without comorbidity)    PLAN:    In order of problems listed above:  1. Coronary artery disease: Post stenting: Secondary prevention stressed with the patient.  Importance of compliance with diet medication stressed and she vocalized understanding.  I reviewed coronary angiography report with her at length again.  In view of the fact that has been ordered a year since her stenting she will go back only on aspirin and drop her Plavix. 2. Essential hypertension: Blood pressure stable and she keeps a track of her blood pressures at home and they are fine. 3. Elevated hemoglobin A1c: Managed by primary care provider.  I told her to be meticulous about diet and losing weight and keep a track of this and follow with the primary care provider for this. 4. Obesity: Weight reduction was stressed and she promises to do better.  She is already done a good job losing 30 pounds.  Risks of obesity explained.Patient will be seen in follow-up appointment in 6 months or earlier if the patient has any concerns    Medication Adjustments/Labs and Tests Ordered: Current medicines are reviewed at length with the patient today.  Concerns regarding medicines are outlined above.  Orders Placed This Encounter  Procedures  . Basic metabolic panel  . Hepatic function panel  . Lipid panel  . EKG 12-Lead   No orders of the defined types were placed in this encounter.    No chief complaint on file.    History of Present Illness:    Amber Sexton is a 64 y.o. female.  Patient has past  medical history of coronary artery disease post stenting in October 2020.  Details are mentioned..  She denies any problems at this time and takes care of activities of daily living.  No chest pain orthopnea or PND.  She is doing well with diet and exercise.  She says she has lost 30 pounds in the past several months.  At the time of my evaluation, the patient is alert awake oriented and in no distress.  Past Medical History:  Diagnosis Date  . Abnormal nuclear stress test 06/02/2019  . Angina pectoris ([place]) 06/02/2019  . Arthritis   . CAD (coronary artery disease) 06/09/2019  . Chest pain 07/21/2015  . COPD (chronic obstructive pulmonary disease) ([place])   . Dyspnea on exertion 04/28/2019  . Essential hypertension 07/21/2015  . Ex-cigarette smoker 07/21/2015  . Meniere disease   . Morbid obesity ([place]) 11/03/2019  . Pulmonary hypertension ([place])   . Sleep apnea 04/28/2019    Past Surgical History:  Procedure Laterality Date  . ABDOMINAL HYSTERECTOMY    . CORONARY STENT INTERVENTION N/A 06/09/2019   Procedure: CORONARY STENT INTERVENTION;  Surgeon: Belva Crome, MD;  Location: [facility];  Service: Cardiovascular;  Laterality: N/A;  . LEFT HEART CATH AND CORONARY ANGIOGRAPHY N/A 06/09/2019   Procedure: LEFT HEART CATH AND CORONARY ANGIOGRAPHY;  Surgeon: Belva Crome, MD;  Location: [facility];  Service: Cardiovascular;  Laterality: N/A;  . TONSILLECTOMY  Current Medications: Current Meds  Medication Sig  . albuterol (PROAIR HFA) 108 (90 Base) MCG/ACT inhaler Inhale 2 puffs into the lungs every 4 (four) hours as needed for wheezing or shortness of breath.   Jearl Klinefelter ELLIPTA 62.5-25 MCG/INH AEPB Inhale 1 puff into the lungs daily.  Marland Kitchen aspirin EC 81 MG tablet Take 81 mg by mouth daily.  . clopidogrel (PLAVIX) 75 MG tablet Take 600 mg Plavix 12 hours after your last dose of Brilinta. Start Plavix 75 mg daily after the initial dose.  . lansoprazole (PREVACID) 30 MG capsule  Take 30 mg by mouth as needed.  Marland Kitchen LUMIGAN 0.01 % SOLN   . Netarsudil-Latanoprost 0.02-0.005 % SOLN Place 1 drop into both eyes at bedtime. ROCKLATAN*  . rosuvastatin (CRESTOR) 40 MG tablet TAKE 1 TABLET BY MOUTH DAILY     Allergies:   Ampicillin   Social History   Socioeconomic History  . Marital status: Divorced    Spouse name: Not on file  . Number of children: Not on file  . Years of education: Not on file  . Highest education level: Not on file  Occupational History  . Not on file  Tobacco Use  . Smoking status: Former Smoker    Packs/day: 1.00    Types: Cigarettes    Quit date: 2014    Years since quitting: 7.8  . Smokeless tobacco: Never Used  Vaping Use  . Vaping Use: Every day  Substance and Sexual Activity  . Alcohol use: No  . Drug use: No  . Sexual activity: Not on file  Other Topics Concern  . Not on file  Social History Narrative  . Not on file   Social Determinants of Health   Financial Resource Strain:   . Difficulty of Paying Living Expenses: Not on file  Food Insecurity:   . Worried About Charity fundraiser in the Last Year: Not on file  . Ran Out of Food in the Last Year: Not on file  Transportation Needs:   . Lack of Transportation (Medical): Not on file  . Lack of Transportation (Non-Medical): Not on file  Physical Activity:   . Days of Exercise per Week: Not on file  . Minutes of Exercise per Session: Not on file  Stress:   . Feeling of Stress : Not on file  Social Connections:   . Frequency of Communication with Friends and Family: Not on file  . Frequency of Social Gatherings with Friends and Family: Not on file  . Attends Religious Services: Not on file  . Active Member of Clubs or Organizations: Not on file  . Attends Archivist Meetings: Not on file  . Marital Status: Not on file     Family History: The patient's family history includes Breast cancer in her sister; Colon cancer in her brother; Diabetes in her brother,  brother, and sister; Hypertension in her brother, brother, and sister.  ROS:   Please see the history of present illness.    All other systems reviewed and are negative.  EKGs/Labs/Other Studies Reviewed:    The following studies were reviewed today: EKG reveals sinus rhythm and nonspecific ST-T changes.  CORONARY STENT INTERVENTION  LEFT HEART CATH AND CORONARY ANGIOGRAPHY  Conclusion   95% proximal RCA treated with 22 x 2.5 mm Onyx postdilated to 4.0 mm in diameter.  TIMI grade III flow but with occlusion of the SA nodal artery.  Bradycardia secondary to SA nodal artery occlusion.  Hopefully this will  improve with as single artery eventually opens as the assumption is that there is ostial narrowing due to stent jail.  Widely patent left main  Luminal irregularities in the proximal to mid LAD.  Widely patent, small circumflex and obtuse marginal territory without significant disease.  Normal LV function.  RECOMMENDATIONS:   Aspirin and Brilinta for 6 months.  High intensity statin therapy.  Monitor heart rhythm.  Beta-blocker therapy is being held due to bradycardia as noted above.    Recent Labs: 12/15/2019: ALT 18; BUN 9; Creatinine, Ser 0.67; Hemoglobin 13.3; Platelets 253; Potassium 3.8; Sodium 140; TSH 3.660  Recent Lipid Panel    Component Value Date/Time   CHOL 145 12/15/2019 1601   TRIG 106 12/15/2019 1601   HDL 67 12/15/2019 1601   CHOLHDL 2.2 12/15/2019 1601   LDLCALC 59 12/15/2019 1601    Physical Exam:    VS:  BP (!) 152/86   Pulse 85   Ht 5\' 5"  (1.651 m)   Wt 215 lb 9.6 oz (97.8 kg)   SpO2 96%   BMI 35.88 kg/m     Wt Readings from Last 3 Encounters:  07/20/20 215 lb 9.6 oz (97.8 kg)  12/15/19 242 lb (109.8 kg)  11/26/19 243 lb (110.2 kg)     GEN: Patient is in no acute distress HEENT: Normal NECK: No JVD; No carotid bruits LYMPHATICS: No lymphadenopathy CARDIAC: Hear sounds regular, 2/6 systolic murmur at the apex. RESPIRATORY:   Clear to auscultation without rales, wheezing or rhonchi  ABDOMEN: Soft, non-tender, non-distended MUSCULOSKELETAL:  No edema; No deformity  SKIN: Warm and dry NEUROLOGIC:  Alert and oriented x 3 PSYCHIATRIC:  Normal affect   Signed, Jenean Lindau, MD  07/20/2020 11:01 AM    [place]

## 2020-07-20 NOTE — Patient Instructions (Signed)
Medication Instructions:  Your physician has recommended you make the following change in your medication:   Stop Plavix.  *If you need a refill on your cardiac medications before your next appointment, please call your pharmacy*   Lab Work: Your physician recommends that you have labs done in the office today. Your test included  basic metabolic panel, liver function and lipids.  If you have labs (blood work) drawn today and your tests are completely normal, you will receive your results only by: Marland Kitchen MyChart Message (if you have MyChart) OR . A paper copy in the mail If you have any lab test that is abnormal or we need to change your treatment, we will call you to review the results.   Testing/Procedures: None ordered   Follow-Up: At [facility], you and your health needs are our priority.  As part of our continuing mission to provide you with exceptional heart care, we have created designated Provider Care Teams.  These Care Teams include your primary Cardiologist (physician) and Advanced Practice Providers (APPs -  Physician Assistants and Nurse Practitioners) who all work together to provide you with the care you need, when you need it.  We recommend signing up for the patient portal called "MyChart".  Sign up information is provided on this After Visit Summary.  MyChart is used to connect with patients for Virtual Visits (Telemedicine).  Patients are able to view lab/test results, encounter notes, upcoming appointments, etc.  Non-urgent messages can be sent to your provider as well.   To learn more about what you can do with MyChart, go to [email or web address].    Your next appointment:   6 month(s)  The format for your next appointment:   In Person  Provider:   Jyl Heinz, MD   Other Instructions NA

## 2020-08-15 IMAGING — DX DG CHEST 1V PORT
1 series · 1 of 1 positions shown · non-contrast
Comparison: June 02, 2019

CLINICAL DATA: Chest pain

EXAM:
PORTABLE CHEST 1 VIEW

[chest ap]
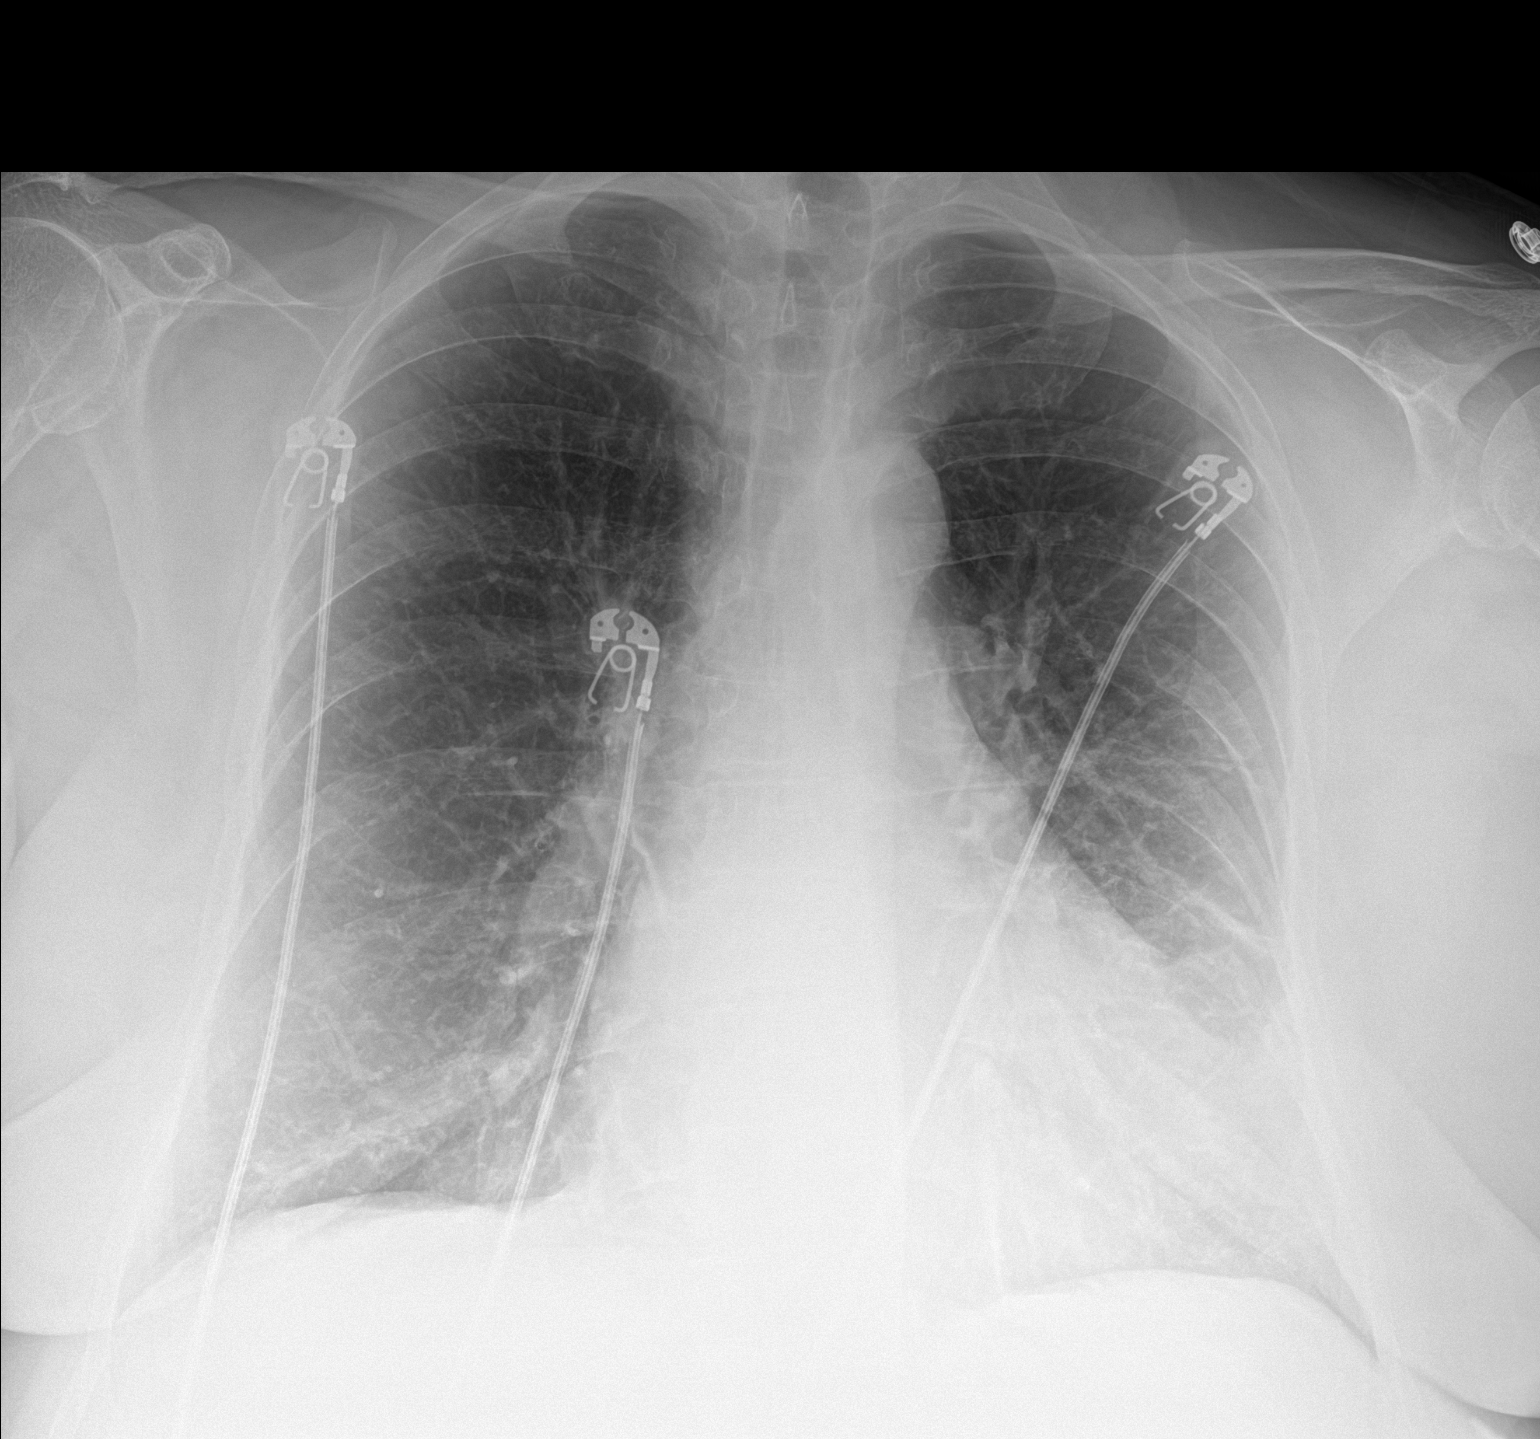

[1 of 1 positions shown; findings below may reference images not displayed]

FINDINGS: Lungs are hyperexpanded. There is scarring in the left mid lung.
There is no edema or consolidation. Heart is borderline enlarged
with pulmonary vascularity normal. No adenopathy. No pneumothorax.
No bone lesions.
IMPRESSION: Lungs mildly hyperexpanded with scarring in left mid lung. No edema
or consolidation. Mild cardiomegaly. No adenopathy.

## 2020-10-18 ENCOUNTER — Telehealth: Payer: Self-pay

## 2020-10-18 MED ORDER — NITROGLYCERIN 0.4 MG SL SUBL: 0.4000 mg | SUBLINGUAL_TABLET | SUBLINGUAL | 3 refills | Status: DC | PRN

## 2020-10-18 NOTE — Telephone Encounter (Signed)
Refill sent to pharmacy.   Nitroglycerin 0.4 mg SL tablet

## 2020-10-19 ENCOUNTER — Telehealth: Payer: Self-pay | Admitting: Cardiology

## 2020-10-19 DIAGNOSIS — I2510 Atherosclerotic heart disease of native coronary artery without angina pectoris: Secondary | ICD-10-CM

## 2020-10-19 NOTE — Telephone Encounter (Signed)
  Pt c/o medication issue:  1. Name of Medication: rosuvastatin (CRESTOR) 40 MG tablet  2. How are you currently taking this medication (dosage and times per day)? As directed  3. Are you having a reaction (difficulty breathing--STAT)? Having diarrhea and stomach cramping  4. What is your medication issue? Patient would like to see if she can try something else due to the stomach issues she is having

## 2020-10-19 NOTE — Telephone Encounter (Signed)
Pt states these are the same sx she had in the past when she took this medication. Pt states she just can't take it anymore with the symptoms it is causing. How do you advise?

## 2020-10-20 ENCOUNTER — Telehealth: Payer: Self-pay

## 2020-10-20 MED ORDER — PITAVASTATIN CALCIUM 1 MG PO TABS: 1.0000 mg | ORAL_TABLET | Freq: Every day | ORAL | 6 refills | Status: DC

## 2020-10-20 NOTE — Telephone Encounter (Signed)
PA approved from 08-28-20 to 08-27-21 for Livalo 1 mg.

## 2020-10-20 NOTE — Addendum Note (Signed)
Addended by: Truddie Hidden on: 10/20/2020 02:05 PM   Modules accepted: Orders

## 2020-10-20 NOTE — Telephone Encounter (Signed)
Stop the medicine and see if she is willing to try another statin after giving herself a break for about a week.  If so we can start low-dose Livalo and get liver lipid check in 6 weeks.

## 2020-10-20 NOTE — Telephone Encounter (Signed)
PA started on CMM. [facility]

## 2020-10-20 NOTE — Telephone Encounter (Signed)
Recommendation reviewed with pt as per Dr. Revankar's note.  Pt verbalized understanding and had no additional questions.   

## 2020-10-27 DIAGNOSIS — E119 Type 2 diabetes mellitus without complications: Secondary | ICD-10-CM | POA: Diagnosis not present

## 2020-10-27 DIAGNOSIS — K529 Noninfective gastroenteritis and colitis, unspecified: Secondary | ICD-10-CM | POA: Diagnosis not present

## 2020-10-27 DIAGNOSIS — R946 Abnormal results of thyroid function studies: Secondary | ICD-10-CM | POA: Diagnosis not present

## 2020-10-27 DIAGNOSIS — R7989 Other specified abnormal findings of blood chemistry: Secondary | ICD-10-CM | POA: Diagnosis not present

## 2020-10-27 DIAGNOSIS — R109 Unspecified abdominal pain: Secondary | ICD-10-CM | POA: Diagnosis not present

## 2020-10-27 DIAGNOSIS — Z13228 Encounter for screening for other metabolic disorders: Secondary | ICD-10-CM | POA: Diagnosis not present

## 2020-11-01 NOTE — Telephone Encounter (Signed)
Amber Sexton is calling stating the medication she was switched to in regards to this has not been received by the pharmacy. She was unsure of the name of the medication, but states she was advised by the office it has been sent twice. Please advise.

## 2020-11-01 NOTE — Telephone Encounter (Signed)
Pt picked medication up but wasn't sure that was her new cholesterol medication.

## 2020-11-05 DIAGNOSIS — H401131 Primary open-angle glaucoma, bilateral, mild stage: Secondary | ICD-10-CM | POA: Diagnosis not present

## 2020-12-20 DIAGNOSIS — R0602 Shortness of breath: Secondary | ICD-10-CM | POA: Diagnosis not present

## 2020-12-20 DIAGNOSIS — R059 Cough, unspecified: Secondary | ICD-10-CM | POA: Diagnosis not present

## 2020-12-20 DIAGNOSIS — J189 Pneumonia, unspecified organism: Secondary | ICD-10-CM | POA: Diagnosis not present

## 2020-12-20 DIAGNOSIS — J439 Emphysema, unspecified: Secondary | ICD-10-CM | POA: Diagnosis not present

## 2020-12-20 DIAGNOSIS — R918 Other nonspecific abnormal finding of lung field: Secondary | ICD-10-CM | POA: Diagnosis not present

## 2021-01-11 ENCOUNTER — Ambulatory Visit: Payer: Medicare HMO | Admitting: Cardiology

## 2021-01-28 DIAGNOSIS — G4733 Obstructive sleep apnea (adult) (pediatric): Secondary | ICD-10-CM | POA: Diagnosis not present

## 2021-01-28 DIAGNOSIS — J449 Chronic obstructive pulmonary disease, unspecified: Secondary | ICD-10-CM | POA: Diagnosis not present

## 2021-01-28 DIAGNOSIS — J301 Allergic rhinitis due to pollen: Secondary | ICD-10-CM | POA: Diagnosis not present

## 2021-01-28 DIAGNOSIS — R5383 Other fatigue: Secondary | ICD-10-CM | POA: Diagnosis not present

## 2021-03-02 ENCOUNTER — Other Ambulatory Visit: Payer: Self-pay

## 2021-03-02 ENCOUNTER — Encounter: Payer: Self-pay | Admitting: Cardiology

## 2021-03-02 ENCOUNTER — Ambulatory Visit (INDEPENDENT_AMBULATORY_CARE_PROVIDER_SITE_OTHER): Payer: Medicare HMO | Admitting: Cardiology

## 2021-03-02 VITALS — BP 140/80 | HR 82 | Ht 65.0 in | Wt 229.0 lb

## 2021-03-02 DIAGNOSIS — Z87891 Personal history of nicotine dependence: Secondary | ICD-10-CM

## 2021-03-02 DIAGNOSIS — I10 Essential (primary) hypertension: Secondary | ICD-10-CM

## 2021-03-02 DIAGNOSIS — E669 Obesity, unspecified: Secondary | ICD-10-CM

## 2021-03-02 DIAGNOSIS — I2510 Atherosclerotic heart disease of native coronary artery without angina pectoris: Secondary | ICD-10-CM

## 2021-03-02 DIAGNOSIS — E782 Mixed hyperlipidemia: Secondary | ICD-10-CM | POA: Insufficient documentation

## 2021-03-02 DIAGNOSIS — Z1329 Encounter for screening for other suspected endocrine disorder: Secondary | ICD-10-CM

## 2021-03-02 HISTORY — DX: Mixed hyperlipidemia: E78.2

## 2021-03-02 NOTE — Patient Instructions (Signed)

## 2021-03-02 NOTE — Progress Notes (Signed)
Cardiology Office Note:    Date:  03/02/2021   ID:  Amber Sexton, DOB [birth year], MRN [id number]  PCP:  Care, [place] [facility]  Cardiologist:  Jenean Lindau, MD   Referring MD: Care, [place]:    1. Essential hypertension   2. Coronary artery disease involving native coronary artery of native heart without angina pectoris   3. Obesity (BMI 35.0-39.9 without comorbidity)   4. Ex-cigarette smoker    PLAN:    In order of problems listed above:  Coronary artery disease: Secondary prevention stressed with the patient.  Importance of compliance with diet medication stressed and she vocalized understanding.  She walks on a regular basis. Essential hypertension: Blood pressure stable and diet was emphasized.  Lifestyle modification urged.  Salt intake issues and diet were encouraged. Mixed dyslipidemia: Patient will be back in the next few days for complete blood work.  She is taking statin therapy. Obesity: Weight reduction stressed risks of obesity emphasized and she promises to do better. Patient will be seen in follow-up appointment in 6 months or earlier if the patient has any concerns    Medication Adjustments/Labs and Tests Ordered: Current medicines are reviewed at length with the patient today.  Concerns regarding medicines are outlined above.  No orders of the defined types were placed in this encounter.  No orders of the defined types were placed in this encounter.    Chief Complaint  Patient presents with   Follow-up     History of Present Illness:    Amber Sexton is a 65 y.o. female.  Patient has past medical history of coronary artery disease, essential hypertension dyslipidemia and morbid obesity.  She denies any problems at this time and takes care of activities of daily living.  She walks on a regular basis.  No chest pain orthopnea or PND.  At the time of my evaluation, the patient is alert awake oriented and in no  distress.  Past Medical History:  Diagnosis Date   Abnormal nuclear stress test 06/02/2019   Angina pectoris ([place]) 06/02/2019   Arthritis    CAD (coronary artery disease) 06/09/2019   Chest pain 07/21/2015   COPD (chronic obstructive pulmonary disease) ([place])    Dyspnea on exertion 04/28/2019   Essential hypertension 07/21/2015   Ex-cigarette smoker 07/21/2015   Meniere disease    Morbid obesity ([place]) 11/03/2019   Obesity (BMI 35.0-39.9 without comorbidity) 07/20/2020   Pulmonary hypertension ([place])    Sleep apnea 04/28/2019    Past Surgical History:  Procedure Laterality Date   ABDOMINAL HYSTERECTOMY     CORONARY STENT INTERVENTION N/A 06/09/2019   Procedure: CORONARY STENT INTERVENTION;  Surgeon: Belva Crome, MD;  Location: [facility];  Service: Cardiovascular;  Laterality: N/A;   LEFT HEART CATH AND CORONARY ANGIOGRAPHY N/A 06/09/2019   Procedure: LEFT HEART CATH AND CORONARY ANGIOGRAPHY;  Surgeon: Belva Crome, MD;  Location: [facility];  Service: Cardiovascular;  Laterality: N/A;   TONSILLECTOMY      Current Medications: Current Meds  Medication Sig   albuterol (VENTOLIN HFA) 108 (90 Base) MCG/ACT inhaler Inhale 2 puffs into the lungs every 4 (four) hours as needed for wheezing or shortness of breath.    ANORO ELLIPTA 62.5-25 MCG/INH AEPB Inhale 1 puff into the lungs daily.   aspirin EC 81 MG tablet Take 81 mg by mouth daily.   lansoprazole (PREVACID) 30 MG capsule Take 30 mg by mouth as needed  for indigestion.   LUMIGAN 0.01 % SOLN Place 1 drop into both eyes daily.   nitroGLYCERIN (NITROSTAT) 0.4 MG SL tablet Place 1 tablet (0.4 mg total) under the tongue every 5 (five) minutes as needed for chest pain.   Pitavastatin Calcium 1 MG TABS Take 1 tablet (1 mg total) by mouth daily.     Allergies:   Ampicillin and Rosuvastatin   Social History   Socioeconomic History   Marital status: Divorced    Spouse name: Not on file   Number of children: Not on  file   Years of education: Not on file   Highest education level: Not on file  Occupational History   Not on file  Tobacco Use   Smoking status: Former    Packs/day: 1.00    Pack years: 0.00    Types: Cigarettes    Quit date: 2014    Years since quitting: 8.5   Smokeless tobacco: Never  Vaping Use   Vaping Use: Every day  Substance and Sexual Activity   Alcohol use: No   Drug use: No   Sexual activity: Not on file  Other Topics Concern   Not on file  Social History Narrative   Not on file   Social Determinants of Health   Financial Resource Strain: Not on file  Food Insecurity: Not on file  Transportation Needs: Not on file  Physical Activity: Not on file  Stress: Not on file  Social Connections: Not on file     Family History: The patient's family history includes Breast cancer in her sister; Colon cancer in her brother; Diabetes in her brother, brother, and sister; Hypertension in her brother, brother, and sister.  ROS:   Please see the history of present illness.    All other systems reviewed and are negative.  EKGs/Labs/Other Studies Reviewed:    The following studies were reviewed today: I discussed my findings with patient at extensive length.  CORONARY STENT INTERVENTION  LEFT HEART CATH AND CORONARY ANGIOGRAPHY    Conclusion  95% proximal RCA treated with 22 x 2.5 mm Onyx postdilated to 4.0 mm in diameter.  TIMI grade III flow but with occlusion of the SA nodal artery. Bradycardia secondary to SA nodal artery occlusion.  Hopefully this will improve with as single artery eventually opens as the assumption is that there is ostial narrowing due to stent jail. Widely patent left main Luminal irregularities in the proximal to mid LAD. Widely patent, small circumflex and obtuse marginal territory without significant disease. Normal LV function.   RECOMMENDATIONS:   Aspirin and Brilinta for 6 months. High intensity statin therapy. Monitor heart  rhythm. Beta-blocker therapy is being held due to bradycardia as noted above.    Recent Labs: 07/20/2020: ALT 14; BUN 9; Creatinine, Ser 0.60; Potassium 3.8; Sodium 139  Recent Lipid Panel    Component Value Date/Time   CHOL 140 07/20/2020 1109   TRIG 84 07/20/2020 1109   HDL 56 07/20/2020 1109   CHOLHDL 2.5 07/20/2020 1109   LDLCALC 68 07/20/2020 1109    Physical Exam:    VS:  Ht 5\' 5"  (1.651 m)   Wt 229 lb (103.9 kg)   BMI 38.11 kg/m     Wt Readings from Last 3 Encounters:  03/02/21 229 lb (103.9 kg)  07/20/20 215 lb 9.6 oz (97.8 kg)  12/15/19 242 lb (109.8 kg)     GEN: Patient is in no acute distress HEENT: Normal NECK: No JVD; No carotid bruits LYMPHATICS: No  lymphadenopathy CARDIAC: Hear sounds regular, 2/6 systolic murmur at the apex. RESPIRATORY:  Clear to auscultation without rales, wheezing or rhonchi  ABDOMEN: Soft, non-tender, non-distended MUSCULOSKELETAL:  No edema; No deformity  SKIN: Warm and dry NEUROLOGIC:  Alert and oriented x 3 PSYCHIATRIC:  Normal affect   Signed, Jenean Lindau, MD  03/02/2021 3:28 PM    [facility]

## 2021-03-07 DIAGNOSIS — Z87891 Personal history of nicotine dependence: Secondary | ICD-10-CM | POA: Diagnosis not present

## 2021-03-07 DIAGNOSIS — I2510 Atherosclerotic heart disease of native coronary artery without angina pectoris: Secondary | ICD-10-CM | POA: Diagnosis not present

## 2021-03-07 DIAGNOSIS — E669 Obesity, unspecified: Secondary | ICD-10-CM | POA: Diagnosis not present

## 2021-03-07 DIAGNOSIS — E782 Mixed hyperlipidemia: Secondary | ICD-10-CM | POA: Diagnosis not present

## 2021-03-07 DIAGNOSIS — H401133 Primary open-angle glaucoma, bilateral, severe stage: Secondary | ICD-10-CM | POA: Diagnosis not present

## 2021-03-07 DIAGNOSIS — I10 Essential (primary) hypertension: Secondary | ICD-10-CM | POA: Diagnosis not present

## 2021-03-07 DIAGNOSIS — Z1329 Encounter for screening for other suspected endocrine disorder: Secondary | ICD-10-CM | POA: Diagnosis not present

## 2021-03-08 LAB — LIPID PANEL
Chol/HDL Ratio: 3.2 ratio (ref 0.0–4.4)
Cholesterol, Total: 191 mg/dL (ref 100–199)
HDL: 60 mg/dL (ref >39)
LDL Chol Calc (NIH): 107 mg/dL — ABNORMAL HIGH (ref 0–99)
Triglycerides: 137 mg/dL (ref 0–149)
VLDL Cholesterol Cal: 24 mg/dL (ref 5–40)

## 2021-03-08 LAB — CBC WITH DIFFERENTIAL/PLATELET
Basophils Absolute: 0.1 10*3/uL (ref 0.0–0.2)
Basos: 1 %
EOS (ABSOLUTE): 0.2 10*3/uL (ref 0.0–0.4)
Eos: 3 %
Hematocrit: 42.8 % (ref 34.0–46.6)
Hemoglobin: 13.8 g/dL (ref 11.1–15.9)
Immature Grans (Abs): 0 10*3/uL (ref 0.0–0.1)
Immature Granulocytes: 0 %
Lymphocytes Absolute: 2.2 10*3/uL (ref 0.7–3.1)
Lymphs: 35 %
MCH: 27 pg (ref 26.6–33.0)
MCHC: 32.2 g/dL (ref 31.5–35.7)
MCV: 84 fL (ref 79–97)
Monocytes Absolute: 0.4 10*3/uL (ref 0.1–0.9)
Monocytes: 6 %
Neutrophils Absolute: 3.5 10*3/uL (ref 1.4–7.0)
Neutrophils: 55 %
Platelets: 243 10*3/uL (ref 150–450)
RBC: 5.11 x10E6/uL (ref 3.77–5.28)
RDW: 13.5 % (ref 11.7–15.4)
WBC: 6.3 10*3/uL (ref 3.4–10.8)

## 2021-03-08 LAB — HEPATIC FUNCTION PANEL
ALT: 10 IU/L (ref 0–32)
AST: 12 IU/L (ref 0–40)
Albumin: 4.5 g/dL (ref 3.8–4.8)
Alkaline Phosphatase: 114 IU/L (ref 44–121)
Bilirubin Total: 0.3 mg/dL (ref 0.0–1.2)
Bilirubin, Direct: 0.1 mg/dL (ref 0.00–0.40)
Total Protein: 7.3 g/dL (ref 6.0–8.5)

## 2021-03-08 LAB — BASIC METABOLIC PANEL
BUN/Creatinine Ratio: 19 (ref 12–28)
BUN: 13 mg/dL (ref 8–27)
CO2: 24 mmol/L (ref 20–29)
Calcium: 9.4 mg/dL (ref 8.7–10.3)
Chloride: 102 mmol/L (ref 96–106)
Creatinine, Ser: 0.68 mg/dL (ref 0.57–1.00)
Glucose: 103 mg/dL — ABNORMAL HIGH (ref 65–99)
Potassium: 4.2 mmol/L (ref 3.5–5.2)
Sodium: 138 mmol/L (ref 134–144)
eGFR: 98 mL/min/{1.73_m2} (ref >59)

## 2021-03-08 LAB — TSH: TSH: 2.71 u[IU]/mL (ref 0.450–4.500)

## 2021-03-08 MED ORDER — PITAVASTATIN CALCIUM 2 MG PO TABS: 2.0000 mg | ORAL_TABLET | Freq: Every day | ORAL | 3 refills | Status: DC

## 2021-03-08 NOTE — Addendum Note (Signed)
Addended by: Truddie Hidden on: 03/08/2021 05:08 PM   Modules accepted: Orders

## 2021-04-20 DIAGNOSIS — E782 Mixed hyperlipidemia: Secondary | ICD-10-CM | POA: Diagnosis not present

## 2021-04-20 DIAGNOSIS — I2510 Atherosclerotic heart disease of native coronary artery without angina pectoris: Secondary | ICD-10-CM | POA: Diagnosis not present

## 2021-04-20 LAB — LIPID PANEL
Chol/HDL Ratio: 3.5 ratio (ref 0.0–4.4)
Cholesterol, Total: 185 mg/dL (ref 100–199)
HDL: 53 mg/dL (ref >39)
LDL Chol Calc (NIH): 110 mg/dL — ABNORMAL HIGH (ref 0–99)
Triglycerides: 126 mg/dL (ref 0–149)
VLDL Cholesterol Cal: 22 mg/dL (ref 5–40)

## 2021-04-20 LAB — HEPATIC FUNCTION PANEL
ALT: 12 IU/L (ref 0–32)
AST: 12 IU/L (ref 0–40)
Albumin: 4.3 g/dL (ref 3.8–4.8)
Alkaline Phosphatase: 133 IU/L — ABNORMAL HIGH (ref 44–121)
Bilirubin Total: 0.3 mg/dL (ref 0.0–1.2)
Bilirubin, Direct: 0.11 mg/dL (ref 0.00–0.40)
Total Protein: 6.6 g/dL (ref 6.0–8.5)

## 2021-04-27 MED ORDER — ATORVASTATIN CALCIUM 10 MG PO TABS: 10.0000 mg | ORAL_TABLET | Freq: Every day | ORAL | 12 refills | Status: DC

## 2021-04-27 MED ORDER — PITAVASTATIN CALCIUM 4 MG PO TABS
4.0000 mg | ORAL_TABLET | Freq: Every day | ORAL | 3 refills | Status: DC
Start: 2021-04-27 — End: 2021-04-27

## 2021-04-27 NOTE — Addendum Note (Signed)
Addended by: Truddie Hidden on: 04/27/2021 09:51 AM   Modules accepted: Orders

## 2021-04-27 NOTE — Addendum Note (Signed)
Addended by: Truddie Hidden on: 04/27/2021 09:37 AM   Modules accepted: Orders

## 2021-05-27 ENCOUNTER — Telehealth: Payer: Self-pay | Admitting: Cardiology

## 2021-05-27 MED ORDER — LANSOPRAZOLE 30 MG PO CPDR: 30.0000 mg | DELAYED_RELEASE_CAPSULE | Freq: Two times a day (BID) | ORAL | 1 refills | Status: DC | PRN

## 2021-05-27 NOTE — Telephone Encounter (Signed)
*  STAT* If patient is at the pharmacy, call can be transferred to refill team.   1. Which medications need to be refilled? (please list name of each medication and dose if known)  lansoprazole (PREVACID) 30 MG capsule ANORO ELLIPTA 62.5-25 MCG/INH AEPB  2. Which pharmacy/location (including street and city if local pharmacy) is medication to be sent to? [facility] [place]  3. Do they need a 30 day or 90 day supply? [place]

## 2021-05-27 NOTE — Telephone Encounter (Signed)
Left VM that these are medications are from her PCP.

## 2021-05-27 NOTE — Telephone Encounter (Signed)
Confirmed with patient lansoprazole she take 1 tab bid as needed. Med sent following instructions on last script dated 12/2019 message. Patient advice we normally don't send Ellipta inhalers and may want to request this with PCP. She verbalized understanding.

## 2021-06-01 DIAGNOSIS — Z23 Encounter for immunization: Secondary | ICD-10-CM | POA: Diagnosis not present

## 2021-06-01 DIAGNOSIS — Z87891 Personal history of nicotine dependence: Secondary | ICD-10-CM | POA: Diagnosis not present

## 2021-06-01 DIAGNOSIS — Z0000 Encounter for general adult medical examination without abnormal findings: Secondary | ICD-10-CM | POA: Diagnosis not present

## 2021-06-01 DIAGNOSIS — Z1231 Encounter for screening mammogram for malignant neoplasm of breast: Secondary | ICD-10-CM | POA: Diagnosis not present

## 2021-06-01 DIAGNOSIS — Z1159 Encounter for screening for other viral diseases: Secondary | ICD-10-CM | POA: Diagnosis not present

## 2021-06-01 DIAGNOSIS — Z1331 Encounter for screening for depression: Secondary | ICD-10-CM | POA: Diagnosis not present

## 2021-06-01 DIAGNOSIS — J449 Chronic obstructive pulmonary disease, unspecified: Secondary | ICD-10-CM | POA: Diagnosis not present

## 2021-06-01 DIAGNOSIS — I2510 Atherosclerotic heart disease of native coronary artery without angina pectoris: Secondary | ICD-10-CM | POA: Diagnosis not present

## 2021-06-01 DIAGNOSIS — E2839 Other primary ovarian failure: Secondary | ICD-10-CM | POA: Diagnosis not present

## 2021-07-08 DIAGNOSIS — H401133 Primary open-angle glaucoma, bilateral, severe stage: Secondary | ICD-10-CM | POA: Diagnosis not present

## 2021-07-08 DIAGNOSIS — H353131 Nonexudative age-related macular degeneration, bilateral, early dry stage: Secondary | ICD-10-CM | POA: Diagnosis not present

## 2021-07-29 DIAGNOSIS — Z1231 Encounter for screening mammogram for malignant neoplasm of breast: Secondary | ICD-10-CM | POA: Diagnosis not present

## 2021-07-29 DIAGNOSIS — Z87891 Personal history of nicotine dependence: Secondary | ICD-10-CM | POA: Diagnosis not present

## 2021-08-11 ENCOUNTER — Other Ambulatory Visit: Payer: Self-pay

## 2021-08-31 ENCOUNTER — Ambulatory Visit: Payer: Medicare HMO | Admitting: Cardiology

## 2021-08-31 ENCOUNTER — Encounter: Payer: Self-pay | Admitting: Cardiology

## 2021-08-31 ENCOUNTER — Other Ambulatory Visit: Payer: Self-pay

## 2021-08-31 VITALS — BP 128/74 | HR 100 | Ht 64.0 in | Wt 230.2 lb

## 2021-08-31 DIAGNOSIS — E782 Mixed hyperlipidemia: Secondary | ICD-10-CM

## 2021-08-31 DIAGNOSIS — M8588 Other specified disorders of bone density and structure, other site: Secondary | ICD-10-CM | POA: Diagnosis not present

## 2021-08-31 DIAGNOSIS — I2510 Atherosclerotic heart disease of native coronary artery without angina pectoris: Secondary | ICD-10-CM | POA: Diagnosis not present

## 2021-08-31 DIAGNOSIS — I10 Essential (primary) hypertension: Secondary | ICD-10-CM | POA: Diagnosis not present

## 2021-08-31 DIAGNOSIS — M810 Age-related osteoporosis without current pathological fracture: Secondary | ICD-10-CM | POA: Insufficient documentation

## 2021-08-31 DIAGNOSIS — E2839 Other primary ovarian failure: Secondary | ICD-10-CM | POA: Diagnosis not present

## 2021-08-31 DIAGNOSIS — I209 Angina pectoris, unspecified: Secondary | ICD-10-CM

## 2021-08-31 DIAGNOSIS — Z87891 Personal history of nicotine dependence: Secondary | ICD-10-CM

## 2021-08-31 HISTORY — DX: Age-related osteoporosis without current pathological fracture: M81.0

## 2021-08-31 NOTE — Patient Instructions (Signed)
Medication Instructions:  Your physician recommends that you continue on your current medications as directed. Please refer to the Current Medication list given to you today.  *If you need a refill on your cardiac medications before your next appointment, please call your pharmacy*   Lab Work: None If you have labs (blood work) drawn today and your tests are completely normal, you will receive your results only by: [place] (if you have MyChart) OR A paper copy in the mail If you have any lab test that is abnormal or we need to change your treatment, we will call you to review the results.   Testing/Procedures: None   Follow-Up: At [facility], you and your health needs are our priority.  As part of our continuing mission to provide you with exceptional heart care, we have created designated Provider Care Teams.  These Care Teams include your primary Cardiologist (physician) and Advanced Practice Providers (APPs -  Physician Assistants and Nurse Practitioners) who all work together to provide you with the care you need, when you need it.  We recommend signing up for the patient portal called "MyChart".  Sign up information is provided on this After Visit Summary.  MyChart is used to connect with patients for Virtual Visits (Telemedicine).  Patients are able to view lab/test results, encounter notes, upcoming appointments, etc.  Non-urgent messages can be sent to your provider as well.   To learn more about what you can do with MyChart, go to [email or web address].    Your next appointment:   9 month(s)  The format for your next appointment:   In Person  Provider:   Jyl Heinz, MD    Other Instructions

## 2021-08-31 NOTE — Progress Notes (Signed)
Cardiology Office Note:    Date:  08/31/2021   ID:  Amber Sexton, DOB October 13, 1955, MRN [id number]  PCP:  Care, [place] [facility]  Cardiologist:  Jenean Lindau, MD   Referring MD: Care, [place]:    1. Coronary artery disease involving native coronary artery of native heart without angina pectoris   2. Essential hypertension   3. Ex-cigarette smoker   4. Mixed dyslipidemia   5. Morbid obesity ([place])    PLAN:    In order of problems listed above:  Coronary artery disease: Secondary prevention stressed with the patient.  Importance of compliance with diet medication stressed and she vocalized understanding.  She was advised to walk at least half an hour a day 5 days a week and she promises to do so. Essential hypertension: Blood pressure stable and diet was emphasized.  Lifestyle modification urged. Mixed dyslipidemia: Lipids were reviewed.  They were done in October.  I discussed this with her at length from the [facility] sheet. Morbid obesity: Weight reduction stressed diet was emphasized.  Risks of obesity emphasized. Sleep apnea: Sleep health issues were discussed. Patient will be seen in follow-up appointment in 9 months or earlier if the patient has any concerns    Medication Adjustments/Labs and Tests Ordered: Current medicines are reviewed at length with the patient today.  Concerns regarding medicines are outlined above.  No orders of the defined types were placed in this encounter.  No orders of the defined types were placed in this encounter.    No chief complaint on file.    History of Present Illness:    Amber Sexton is a 66 y.o. female.  Patient has past medical history of coronary artery disease post stenting, essential hypertension dyslipidemia and morbid obesity.  She leads a sedentary lifestyle.  She has sleep apnea.  She denies any chest pain orthopnea or PND.  At the time of my evaluation, the patient is alert awake  oriented and in no distress.  Past Medical History:  Diagnosis Date   Abnormal nuclear stress test 06/02/2019   Angina pectoris ([place]) 06/02/2019   Arthritis    CAD (coronary artery disease) 06/09/2019   Chest pain 07/21/2015   COPD (chronic obstructive pulmonary disease) ([place])    Dyspnea on exertion 04/28/2019   Essential hypertension 07/21/2015   Ex-cigarette smoker 07/21/2015   Meniere disease    Mixed dyslipidemia 03/02/2021   Morbid obesity ([place]) 11/03/2019   Obesity (BMI 35.0-39.9 without comorbidity) 07/20/2020   Pulmonary hypertension ([place])    Sleep apnea 04/28/2019    Past Surgical History:  Procedure Laterality Date   ABDOMINAL HYSTERECTOMY     CORONARY STENT INTERVENTION N/A 06/09/2019   Procedure: CORONARY STENT INTERVENTION;  Surgeon: Belva Crome, MD;  Location: [place] [facility];  Service: Cardiovascular;  Laterality: N/A;   LEFT HEART CATH AND CORONARY ANGIOGRAPHY N/A 06/09/2019   Procedure: LEFT HEART CATH AND CORONARY ANGIOGRAPHY;  Surgeon: Belva Crome, MD;  Location: [place] [facility];  Service: Cardiovascular;  Laterality: N/A;   TONSILLECTOMY      Current Medications: Current Meds  Medication Sig   albuterol (VENTOLIN HFA) 108 (90 Base) MCG/ACT inhaler Inhale 2 puffs into the lungs every 4 (four) hours as needed for wheezing or shortness of breath.    ANORO ELLIPTA 62.5-25 MCG/INH AEPB Inhale 1 puff into the lungs daily.   aspirin EC 81 MG tablet Take 81 mg by mouth daily.   atorvastatin (  LIPITOR) 10 MG tablet Take 10 mg by mouth daily.   LUMIGAN 0.01 % SOLN Place 1 drop into both eyes daily.   nitroGLYCERIN (NITROSTAT) 0.4 MG SL tablet Place 0.4 mg under the tongue every 5 (five) minutes as needed for chest pain.     Allergies:   Ampicillin, Rosuvastatin, and Pitavastatin   Social History   Socioeconomic History   Marital status: Divorced    Spouse name: Not on file   Number of children: Not on file   Years of education: Not on file   Highest  education level: Not on file  Occupational History   Not on file  Tobacco Use   Smoking status: Former    Packs/day: 1.00    Types: Cigarettes    Quit date: 2014    Years since quitting: 9.0   Smokeless tobacco: Never  Vaping Use   Vaping Use: Every day  Substance and Sexual Activity   Alcohol use: No   Drug use: No   Sexual activity: Not on file  Other Topics Concern   Not on file  Social History Narrative   Not on file   Social Determinants of Health   Financial Resource Strain: Not on file  Food Insecurity: Not on file  Transportation Needs: Not on file  Physical Activity: Not on file  Stress: Not on file  Social Connections: Not on file     Family History: The patient's family history includes Breast cancer in her sister; Colon cancer in her brother; Diabetes in her brother, brother, and sister; Hypertension in her brother, brother, and sister.  ROS:   Please see the history of present illness.    All other systems reviewed and are negative.  EKGs/Labs/Other Studies Reviewed:    The following studies were reviewed today: I discussed my findings with the patient at length.  EKG reveals sinus rhythm and nonspecific ST-T changes.   Recent Labs: 03/07/2021: BUN 13; Creatinine, Ser 0.68; Hemoglobin 13.8; Platelets 243; Potassium 4.2; Sodium 138; TSH 2.710 04/20/2021: ALT 12  Recent Lipid Panel    Component Value Date/Time   CHOL 185 04/20/2021 0943   TRIG 126 04/20/2021 0943   HDL 53 04/20/2021 0943   CHOLHDL 3.5 04/20/2021 0943   LDLCALC 110 (H) 04/20/2021 0943    Physical Exam:    VS:  BP 128/74    Pulse 100    Ht 5\' 4"  (1.626 m)    Wt 230 lb 3.2 oz (104.4 kg)    SpO2 95%    BMI 39.51 kg/m     Wt Readings from Last 3 Encounters:  08/31/21 230 lb 3.2 oz (104.4 kg)  03/02/21 229 lb (103.9 kg)  07/20/20 215 lb 9.6 oz (97.8 kg)     GEN: Patient is in no acute distress HEENT: Normal NECK: No JVD; No carotid bruits LYMPHATICS: No  lymphadenopathy CARDIAC: Hear sounds regular, 2/6 systolic murmur at the apex. RESPIRATORY:  Clear to auscultation without rales, wheezing or rhonchi  ABDOMEN: Soft, non-tender, non-distended MUSCULOSKELETAL:  No edema; No deformity  SKIN: Warm and dry NEUROLOGIC:  Alert and oriented x 3 PSYCHIATRIC:  Normal affect   Signed, Jenean Lindau, MD  08/31/2021 1:16 PM    [facility]

## 2021-09-14 DIAGNOSIS — N6325 Unspecified lump in the left breast, overlapping quadrants: Secondary | ICD-10-CM | POA: Diagnosis not present

## 2021-09-14 DIAGNOSIS — N6322 Unspecified lump in the left breast, upper inner quadrant: Secondary | ICD-10-CM | POA: Diagnosis not present

## 2021-09-14 DIAGNOSIS — R922 Inconclusive mammogram: Secondary | ICD-10-CM | POA: Diagnosis not present

## 2021-09-14 DIAGNOSIS — N6321 Unspecified lump in the left breast, upper outer quadrant: Secondary | ICD-10-CM | POA: Diagnosis not present

## 2021-09-22 DIAGNOSIS — N6322 Unspecified lump in the left breast, upper inner quadrant: Secondary | ICD-10-CM | POA: Diagnosis not present

## 2021-09-22 DIAGNOSIS — R928 Other abnormal and inconclusive findings on diagnostic imaging of breast: Secondary | ICD-10-CM | POA: Diagnosis not present

## 2021-09-22 DIAGNOSIS — D0512 Intraductal carcinoma in situ of left breast: Secondary | ICD-10-CM | POA: Diagnosis not present

## 2021-09-29 NOTE — Progress Notes (Signed)
Initial phone contact with newly diagnosed pt. Appt made with [name] per pt request for 10/05/21 at 10;00. Directions given. Encourgaged pt to stop by and pick up "The Breast Cancer Treatment Handbook at the [name].

## 2021-10-05 DIAGNOSIS — D0512 Intraductal carcinoma in situ of left breast: Secondary | ICD-10-CM | POA: Diagnosis not present

## 2021-10-06 ENCOUNTER — Telehealth: Payer: Self-pay | Admitting: Genetic Counselor

## 2021-10-06 NOTE — Telephone Encounter (Signed)
Called Amber Sexton to discuss timing of genetic testing results.  Patient expressed that she would like results back prior to surgery if they could change surgical discussions/recommendations. Patient stated that surgery date is scheduled for 2/15 at this point in time.  Would not get results back prior to surgery.  Patient requested that I reach out to Dr. Maxie Barb office to see if positive results may change surgical recommendations.  Spoke with Pam from Dr. Maxie Barb office.  Pam stated she will speak with [name] and give me a call tomorrow.

## 2021-10-07 NOTE — Telephone Encounter (Signed)
Patient now agrees to wait until after surgery to proceed with genetic testing.  Genetics appt 3/3.  Dr. Maxie Barb office notified.

## 2021-10-10 ENCOUNTER — Telehealth: Payer: Self-pay

## 2021-10-10 NOTE — Telephone Encounter (Signed)
° °  Pre-operative Risk Assessment    Patient Name: Amber Sexton  DOB: Aug 29, 1955 MRN: [id number]      Request for Surgical Clearance    Procedure:   left breast lumpectomy  Date of Surgery:  Clearance 10/13/21                                 Surgeon:  Dr. Romualdo Bolk Group or Practice Name:  [facility] Phone number:  [phone] Fax number:  [phone]   Type of Clearance Requested:   - Medical    Type of Anesthesia:  General    Additional requests/questions:    SignedTruddie Hidden   10/10/2021, 7:52 AM

## 2021-10-14 ENCOUNTER — Telehealth: Payer: Self-pay | Admitting: *Deleted

## 2021-10-14 NOTE — Telephone Encounter (Deleted)
°  Truddie Hidden, RN  Registered Nurse Telephone Encounter Signed Encounter Date:  10/10/2021   Signed                                                                                                                                                                       Pre-operative Risk Assessment    Patient Name: Amber Sexton  DOB: [birth year] MRN: [id number]        Request for Surgical Clearance     Procedure:   left breast lumpectomy   Date of Surgery:  Clearance 10/13/21                                 Surgeon:  Dr. Romualdo Bolk Group or Practice Name:  [facility] Phone number:  [phone] Fax number:  [phone]   Type of Clearance Requested:   - Medical    Type of Anesthesia:  General    Additional requests/questions:     SignedTruddie Hidden   10/10/2021, 7:52 AM          Electronically signed by Truddie Hidden, RN at 10/10/2021  8:01 AM

## 2021-10-14 NOTE — Telephone Encounter (Signed)
LMTCB

## 2021-10-14 NOTE — Telephone Encounter (Signed)
October 10, 2021 Truddie Hidden, RN      8:01 AM Note   Pre-operative Risk Assessment    Patient Name: Amber Sexton  DOB: 1956-06-14 MRN: [id number]        Request for Surgical Clearance     Procedure:   left breast lumpectomy   Date of Surgery:  Clearance 10/13/21                                 Surgeon:  Dr. Romualdo Bolk Group or Practice Name:  [facility] Phone number:  [phone] Fax number:  [phone]   Type of Clearance Requested:   - Medical    Type of Anesthesia:  General    Additional requests/questions:     SignedTruddie Hidden   10/10/2021, 7:52 AM

## 2021-10-18 DIAGNOSIS — Z7951 Long term (current) use of inhaled steroids: Secondary | ICD-10-CM | POA: Diagnosis not present

## 2021-10-18 DIAGNOSIS — Z87891 Personal history of nicotine dependence: Secondary | ICD-10-CM | POA: Diagnosis not present

## 2021-10-18 DIAGNOSIS — N6019 Diffuse cystic mastopathy of unspecified breast: Secondary | ICD-10-CM | POA: Diagnosis not present

## 2021-10-18 DIAGNOSIS — I10 Essential (primary) hypertension: Secondary | ICD-10-CM | POA: Diagnosis not present

## 2021-10-18 DIAGNOSIS — J439 Emphysema, unspecified: Secondary | ICD-10-CM | POA: Diagnosis not present

## 2021-10-18 DIAGNOSIS — Z85828 Personal history of other malignant neoplasm of skin: Secondary | ICD-10-CM | POA: Diagnosis not present

## 2021-10-18 DIAGNOSIS — Z803 Family history of malignant neoplasm of breast: Secondary | ICD-10-CM | POA: Diagnosis not present

## 2021-10-18 DIAGNOSIS — D0512 Intraductal carcinoma in situ of left breast: Secondary | ICD-10-CM | POA: Diagnosis not present

## 2021-10-18 DIAGNOSIS — C50912 Malignant neoplasm of unspecified site of left female breast: Secondary | ICD-10-CM | POA: Diagnosis not present

## 2021-10-18 DIAGNOSIS — J449 Chronic obstructive pulmonary disease, unspecified: Secondary | ICD-10-CM | POA: Diagnosis not present

## 2021-10-18 NOTE — Telephone Encounter (Signed)
° °  Patient Name: Amber Sexton  DOB: 27-Jul-1956 MRN: [id number]  Primary Cardiologist: Jenean Lindau, MD  Chart reviewed as part of pre-operative protocol coverage. Patient was last seen in office on 08/31/21 and was stable from cardiac standpoint. Request received for medical clearance for left breast lumpectomy, scheduled for 10/13/21. Patient was called on 10/14/21 with no answer, voicemail left for patient to call back. Attempted to call again on 10/18/21, no answer. Left voicemail for patient to call back to determine whether patient still needs clearance for surgery.    Lenna Sciara, NP 10/18/2021, 9:16 AM

## 2021-10-24 NOTE — Telephone Encounter (Signed)
Preoperative team, phone numbers that are given on request do not allow contact with patient.  Please contact requesting office and let them know that we have not been able to contact patient as part of preoperative evaluation.  I will remove patient from preoperative pool.  Please ask requesting office to submit new form with updated contact information if patient is proceeding with procedure.  Thank you for your help.  Jossie Ng. Alp Goldwater NP-C    10/24/2021, 2:14 PM [place] [address] Office [phone] Fax [phone]

## 2021-10-24 NOTE — Telephone Encounter (Signed)
I s/w Ivin Booty at Dr. Maxie Barb office. Ivin Booty, informed me that the pt did have her surgery 10/18/21. Ivin Booty, did explain as they had not heard back from cardiology as we had not be able to reach the pt for pre op clearance, she did obtain notes, EKG, labs ext. This information was presented to the anesthesiologist, who then may the decision based on cardiac information obtained ok to proceed with the surgery, per Ivin Booty. Pt's surgery was for breat cancer. I assured Ivin Booty that I will update the pre op provider, surgery has been completed.

## 2021-10-24 NOTE — Telephone Encounter (Signed)
Left very detailed message for requesting office. First needed to let requesting office know that we have been unable to reach the pt for pre op assessment. Second the pre op provider is needing to know if procedure still needing to be done, if so please fax over a new clearance with a new date for procedure. Lastly, if the requesting office does speak with the pt , if they please tell her to call the cardiology office so that we may s/w her about the procedure.   I will send this note as FYI to requesting office.

## 2021-10-27 ENCOUNTER — Other Ambulatory Visit: Payer: Self-pay | Admitting: Genetic Counselor

## 2021-10-27 DIAGNOSIS — D0512 Intraductal carcinoma in situ of left breast: Secondary | ICD-10-CM

## 2021-10-27 DIAGNOSIS — Z803 Family history of malignant neoplasm of breast: Secondary | ICD-10-CM

## 2021-10-28 ENCOUNTER — Inpatient Hospital Stay: Payer: Medicare HMO | Attending: Hematology and Oncology | Admitting: Genetic Counselor

## 2021-10-28 ENCOUNTER — Inpatient Hospital Stay: Payer: Medicare HMO

## 2021-10-28 ENCOUNTER — Other Ambulatory Visit: Payer: Self-pay

## 2021-10-28 DIAGNOSIS — Z800 Family history of malignant neoplasm of digestive organs: Secondary | ICD-10-CM | POA: Diagnosis not present

## 2021-10-28 DIAGNOSIS — Z8042 Family history of malignant neoplasm of prostate: Secondary | ICD-10-CM | POA: Diagnosis not present

## 2021-10-28 DIAGNOSIS — Z803 Family history of malignant neoplasm of breast: Secondary | ICD-10-CM

## 2021-10-28 DIAGNOSIS — D0510 Intraductal carcinoma in situ of unspecified breast: Secondary | ICD-10-CM

## 2021-10-28 DIAGNOSIS — Z8049 Family history of malignant neoplasm of other genital organs: Secondary | ICD-10-CM | POA: Diagnosis not present

## 2021-10-31 ENCOUNTER — Encounter: Payer: Self-pay | Admitting: Genetic Counselor

## 2021-10-31 ENCOUNTER — Other Ambulatory Visit: Payer: Medicare HMO

## 2021-10-31 ENCOUNTER — Encounter: Payer: Medicare HMO | Admitting: Licensed Clinical Social Worker

## 2021-10-31 DIAGNOSIS — Z8042 Family history of malignant neoplasm of prostate: Secondary | ICD-10-CM

## 2021-10-31 DIAGNOSIS — Z803 Family history of malignant neoplasm of breast: Secondary | ICD-10-CM

## 2021-10-31 DIAGNOSIS — D0510 Intraductal carcinoma in situ of unspecified breast: Secondary | ICD-10-CM

## 2021-10-31 HISTORY — DX: Family history of malignant neoplasm of breast: Z80.3

## 2021-10-31 HISTORY — DX: Family history of malignant neoplasm of prostate: Z80.42

## 2021-10-31 HISTORY — DX: Intraductal carcinoma in situ of unspecified breast: D05.10

## 2021-10-31 NOTE — Progress Notes (Signed)
REFERRING PROVIDER: ?Morgan, James H, MD ?[address] ?[address] ?[place],  [place] [address] ? ? ?PRIMARY PROVIDER:  ?[facility] ? ?PRIMARY REASON FOR VISIT:  ?1. Ductal carcinoma in situ (DCIS) of breast, unspecified laterality   ?2. Family history of breast cancer   ?3. Family history of prostate cancer   ? ? ?HISTORY OF PRESENT ILLNESS:   ?Ms. Pullen, a 65 y.o. female, was seen for a [facility] cancer genetics consultation at the request of Dr. Morgan due to a personal and family history of cancer.  Ms. Lampley presents to clinic today to discuss the possibility of a hereditary predisposition to cancer, to discuss genetic testing, and to further clarify her future cancer risks, as well as potential cancer risks for family members.  ? ?In 2023, at the age of 64, Ms. 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  ? ? ?RISK FACTORS:  ?Mammogram within the last year: yes ?Colonoscopy: yes;  approx 5 years ago . ?Hysterectomy: yes ?Ovaries intact: yes.  ?Menarche was at age 12.  ?First live birth at age 16.  ?Menopausal status: postmenopausal.  ?OCP use for less than 1 years.  ?HRT use: 0 years. ? ? ?Past Medical History:  ?Diagnosis Date  ? Abnormal nuclear stress test 06/02/2019  ? Angina pectoris ([facility]) 06/02/2019  ? Arthritis   ? CAD (coronary artery disease) 06/09/2019  ? Chest pain 07/21/2015  ? COPD (chronic obstructive pulmonary disease) ([facility])   ? Ductal carcinoma in situ (DCIS) of breast 10/31/2021  ? Dyspnea on exertion 04/28/2019  ? Essential hypertension 07/21/2015  ? Ex-cigarette smoker 07/21/2015  ? Family history of breast cancer 10/31/2021  ? Family history of prostate cancer 10/31/2021  ? Meniere disease   ? Mixed dyslipidemia 03/02/2021  ? Morbid obesity ([facility]) 11/03/2019  ? Obesity (BMI 35.0-39.9 without comorbidity) 07/20/2020  ? Pulmonary hypertension ([facility])   ? Sleep apnea 04/28/2019  ? ? ?Past Surgical History:  ?Procedure Laterality Date  ? ABDOMINAL  HYSTERECTOMY    ? CORONARY STENT INTERVENTION N/A 06/09/2019  ? Procedure: CORONARY STENT INTERVENTION;  Surgeon: Smith, Henry W, MD;  Location: [facility];  Service: Cardiovascular;  Laterality: N/A;  ? LEFT HEART CATH AND CORONARY ANGIOGRAPHY N/A 06/09/2019  ? Procedure: LEFT HEART CATH AND CORONARY ANGIOGRAPHY;  Surgeon: Smith, Henry W, MD;  Location: [facility];  Service: Cardiovascular;  Laterality: N/A;  ? TONSILLECTOMY    ? ? ?FAMILY HISTORY:  ?We obtained a detailed, 4-generation family history.  Significant diagnoses are listed below: ?Family History  ?Problem Relation Age of Onset  ? Breast cancer Sister 64  ? Prostate cancer Brother 60  ? Colon cancer Daughter 35  ? Uterine cancer Daughter 44  ? ? ? ?Ms. Szafran is unaware of previous family history of genetic testing for hereditary cancer risks. There is no reported Ashkenazi Jewish ancestry. There is reported consanguinity between her parents.  Degree of relation not reported.  ? ?GENETIC COUNSELING ASSESSMENT: Ms. Marulanda is a 65 y.o. female with a personal and family history of cancer which is somewhat suggestive of a hereditary cancer syndrome and predisposition to cancer given the presence of related cancers in the family. We, therefore, discussed and recommended the following at today's visit.  ? ?DISCUSSION: We discussed that 5 - 10% of cancer is hereditary.  Most cases of hereditary breast cancer are associated with mutations in BRCA1/2.  There are other   genes that can be associated with hereditary breast cancer syndromes.  We discussed that testing is beneficial for several reasons including knowing how to follow individuals for their cancer risks, identifying whether potential treatment options would be beneficial, and understanding if other family members could be at risk for cancer and allowing them to undergo genetic testing.  ? ?We reviewed the characteristics, features and inheritance patterns of hereditary cancer  syndromes. We also discussed genetic testing, including the appropriate family members to test, the process of testing, insurance coverage and turn-around-time for results. We discussed the implications of a negative, positive, carrier and/or variant of uncertain significant result. We recommended Ms. Trombly pursue genetic testing for a panel that includes genes associated with breast, prostate, colon, and uterine cancer.  ? ?The CancerNext-Expanded gene panel offered by [facility] and includes sequencing, rearrangement, and RNA analysis for the following 77 genes: AIP, ALK, APC, ATM, AXIN2, BAP1, BARD1, BLM, BMPR1A, BRCA1, BRCA2, BRIP1, CDC73, CDH1, CDK4, CDKN1B, CDKN2A, CHEK2, CTNNA1, DICER1, FANCC, FH, FLCN, GALNT12, KIF1B, LZTR1, MAX, MEN1, MET, MLH1, MSH2, MSH3, MSH6, MUTYH, NBN, NF1, NF2, NTHL1, PALB2, PHOX2B, PMS2, POT1, PRKAR1A, PTCH1, PTEN, RAD51C, RAD51D, RB1, RECQL, RET, SDHA, SDHAF2, SDHB, SDHC, SDHD, SMAD4, SMARCA4, SMARCB1, SMARCE1, STK11, SUFU, TMEM127, TP53, TSC1, TSC2, VHL and XRCC2 (sequencing and deletion/duplication); EGFR, EGLN1, HOXB13, KIT, MITF, PDGFRA, POLD1, and POLE (sequencing only); EPCAM and GREM1 (deletion/duplication only).  ? ?Based on Ms. Doane's personal and family history of cancer, she meets medical criteria for genetic testing. Despite that she meets criteria, she may still have an out of pocket cost.  ? ?PLAN: After considering the risks, benefits, and limitations, Ms. Heese provided informed consent to pursue genetic testing and the blood sample was sent to [facility] for analysis of the CancerNext-Expanded +RNAinsight Panel. Results should be available within approximately 3 weeks' time, at which point they will be disclosed by telephone to Ms. Falwell, as will any additional recommendations warranted by these results. Ms. Agner will receive a summary of her genetic counseling visit and a copy of her results once available. This information will also  be available in Epic.  ? ?Lastly, we encouraged Ms. Thrun to remain in contact with cancer genetics annually so that we can continuously update the family history and inform her of any changes in cancer genetics and testing that may be of benefit for this family.  ? ?Ms. Delorey's questions were answered to her satisfaction today. Our contact information was provided should additional questions or concerns arise. Thank you for the referral and allowing us to share in the care of your patient.  ? ?Cari M. Koerner, MS, LCGC ?Genetic Counselor ?[email or web address] ?(P) [phone] ? ?The patient was seen for a total of 30 minutes in face-to-face genetic counseling.  The patient was accompanied by her daughter, Patricia.  Drs. Gudena and/or Feng were available to discuss this case as needed.  ? ? ?_______________________________________________________________________ ?For Office Staff:  ?Number of people involved in session: 2 ?Was an Intern/ student involved with case: no ? ?

## 2021-11-08 ENCOUNTER — Other Ambulatory Visit: Payer: Self-pay | Admitting: Oncology

## 2021-11-08 DIAGNOSIS — D0512 Intraductal carcinoma in situ of left breast: Secondary | ICD-10-CM

## 2021-11-08 NOTE — Progress Notes (Signed)
?[name]  ?[address] ?[place],  [place]  [id number] ?[phone] ? ?Clinic Day: 11/09/21 ? ?Referring physician: Orrin Brigham, MD ? ? ?ASSESSMENT & PLAN:  ? ?Ductal carcinoma in situ of the left breast ?Stage 0 grade 1, 12 mm Tis N0 M0 with estrogen and progesterone receptors at 100%.  Her prognosis is great and I have discussed the diagnosis with her.  We also discussed the concept of chemoprevention and the risks and benefits.  She is in agreement to try raloxifene and I will prescribe 60 mg daily.  She will call if she has any questions or problems with this medication. ? ?Osteoporosis ?This was confirmed on a bone density scan of August 31, 2021.  She is on vitamin D weekly and calcium twice daily.  Hopefully the raloxifene will help her bones as well. ? ?Strong family history of breast cancer and prostate cancer ?She has met with the genetic counselor on March 3 and genetic testing was sent to the [facility].  Results are pending.  She will have counseling once the results are available. ? ? ?I discussed the assessment and treatment plan with the patient.  The patient was provided an opportunity to ask questions and all were answered.  The patient agreed with the plan and demonstrated an understanding of the instructions.  The patient was advised to call back if the symptoms worsen or if the condition fails to improve as anticipated. ? ?Thank you for the opportunity to participate in the care of your patients. ? ?I provided 45 minutes of face-to-face time during this this encounter and > 50% was spent counseling as documented under my assessment and plan.  ? ? ?Derwood Kaplan, MD ?[facility] ?[place] ?[place] [address] ?Dept: [phone] ?Dept Fax: [phone]  ? ?Derwood Kaplan, MD   ?3/26/20232:09 PM ? ?CHIEF COMPLAINT:  ?CC: New ductal carcinoma in situ ? ?Current  Treatment: Chemoprevention with raloxifene ? ? ?HISTORY OF PRESENT ILLNESS:  ?Amber Sexton is a 66 y.o. female with newly diagnosed ductal carcinoma in situ of the left breast who is referred in consultation from Dr. Orrin Brigham for assessment and management.  This was found on a screening mammogram which was performed on December 2 and revealed possible masses of the left breast.  She came back for diagnostic mammogram on September 14, 2021 and this confirmed isodense masses measuring 9 mm at 12 o'clock and another at 3 o'clock, measuring 8 mm.  An ultrasound was performed that same day and confirmed that the mass at 12 o'clock was mixed cystic and solid and indeterminant, so a biopsy was recommended. This measured approximately 1.1 cm.  The lesion at 3 o'clock was felt to be a benign cyst measuring 0.8 cm.  A biopsy was performed of the suspicious lesion on January 26 and revealed ductal carcinoma in situ.  She had surgery on February 21 with lumpectomy and clear margins were obtained.  The final pathology revealed a ductal carcinoma in situ with micropapillary and cribriform patterns measuring 12 mm and grade 1.  Estrogen and progesterone receptors were both highly positive at 100%.  She has also had a bone density scan on August 31, 2021, which revealed osteoporosis of the femur with a T score of -3.0 and osteopenia of the spine with a T score measuring -2.2.  She does take calcium twice daily and has been placed on high-dose vitamin  D weekly. ? ?INTERVAL HISTORY:  ?I have reviewed her chart and materials related to her cancer extensively and collaborated history with the patient. Summary of oncologic history is as follows: ?Oncology History  ?Ductal carcinoma in situ (DCIS) of breast  ?10/31/2021 Initial Diagnosis  ? Ductal carcinoma in situ (DCIS) of breast ?  ?11/15/2021 Genetic Testing  ? Negative hereditary cancer genetic testing: no pathogenic variants detected in [facility] CancerNext-Expanded +RNAinsight  Panel.  Report date is 11/15/2021.  ? ?The CancerNext-Expanded gene panel offered by [facility] and includes sequencing, rearrangement, and RNA analysis for the following 77 genes: AIP, ALK, APC, ATM, AXIN2, BAP1, BARD1, BLM, BMPR1A, BRCA1, BRCA2, BRIP1, CDC73, CDH1, CDK4, CDKN1B, CDKN2A, CHEK2, CTNNA1, DICER1, FANCC, FH, FLCN, GALNT12, KIF1B, LZTR1, MAX, MEN1, MET, MLH1, MSH2, MSH3, MSH6, MUTYH, NBN, NF1, NF2, NTHL1, PALB2, PHOX2B, PMS2, POT1, PRKAR1A, PTCH1, PTEN, RAD51C, RAD51D, RB1, RECQL, RET, SDHA, SDHAF2, SDHB, SDHC, SDHD, SMAD4, SMARCA4, SMARCB1, SMARCE1, STK11, SUFU, TMEM127, TP53, TSC1, TSC2, VHL and XRCC2 (sequencing and deletion/duplication); EGFR, EGLN1, HOXB13, KIT, MITF, PDGFRA, POLD1, and POLE (sequencing only); EPCAM and GREM1 (deletion/duplication only).  ?  ? ? ?Jakira is seen in the clinic for follow up of her ductal carcinoma in situ.  Her last mammogram had been about 5 years ago and so she was not getting this yearly.  She had menarche at age 65 and had surgical menopause at age 76 with a partial hysterectomy for dysfunctional uterine bleeding.  This was performed after multiple other hormonal maneuvers were unsuccessful.  Her ovaries were left intact.  She was not placed on hormone replacement therapy.  She had her first birth at age 43.  She inquires about her genetic test results as she had already met with the genetic counselor on March 3.  These results are not yet available.  CBC and comprehensive metabolic profile from today are unremarkable other than a nonfasting blood sugar of 153.  She is up-to-date on colonoscopy and will be due at 5 years.  She denies fever, chills, night sweats, or other signs of infection. She denies cardiorespiratory and gastrointestinal issues. She  denies pain. Her appetite is good. Her weight has been stable.  She has met with Dr. Orlene Erm of radiation oncology and will be getting radiation therapy.  She will have her simulation today. ? ?HISTORY:  ? ?Past  Medical History:  ?Diagnosis Date  ? Abnormal nuclear stress test 06/02/2019  ? Angina pectoris ([place]) 06/02/2019  ? Arthritis   ? CAD (coronary artery disease) 06/09/2019  ? Chest pain 07/21/2015  ? COPD (chronic obstructive pulmonary disease) ([place])   ? Ductal carcinoma in situ (DCIS) of breast 10/31/2021  ? Dyspnea on exertion 04/28/2019  ? Essential hypertension 07/21/2015  ? Ex-cigarette smoker 07/21/2015  ? Family history of breast cancer 10/31/2021  ? Family history of prostate cancer 10/31/2021  ? Meniere disease   ? Mixed dyslipidemia 03/02/2021  ? Morbid obesity ([place]) 11/03/2019  ? Obesity (BMI 35.0-39.9 without comorbidity) 07/20/2020  ? Pulmonary hypertension ([place])   ? Sleep apnea 04/28/2019  ? ? ?Past Surgical History:  ?Procedure Laterality Date  ? ABDOMINAL HYSTERECTOMY    ? CORONARY STENT INTERVENTION N/A 06/09/2019  ? Procedure: CORONARY STENT INTERVENTION;  Surgeon: Belva Crome, MD;  Location: [facility];  Service: Cardiovascular;  Laterality: N/A;  ? LEFT HEART CATH AND CORONARY ANGIOGRAPHY N/A 06/09/2019  ? Procedure: LEFT HEART CATH AND CORONARY ANGIOGRAPHY;  Surgeon: Belva Crome, MD;  Location: [facility];  Service: Cardiovascular;  Laterality: N/A;  ? TONSILLECTOMY    ? ? ?Family History  ?Problem Relation Age of Onset  ? Diabetes Sister   ? Breast cancer Sister 24  ? Hypertension Sister   ? Diabetes Brother   ? Hypertension Brother   ? Diabetes Brother   ? Prostate cancer Brother 4  ? Hypertension Brother   ? Colon cancer Daughter 41  ? Uterine cancer Daughter 67  ? ? ?Social History:  reports that she quit smoking about 9 years ago. Her smoking use included cigarettes. She smoked an average of 1 pack per day. She has never used smokeless tobacco. She reports that she does not drink alcohol and does not use drugs.The patient is accompanied by her daughter Mardene Celeste today.  She does have 2 children.  She is divorced and disabled.  She previously worked at [employer]. ? ?Allergies:  ?Allergies   ?Allergen Reactions  ? Ampicillin Anaphylaxis  ?  Did it involve swelling of the face/tongue/throat, SOB, or low BP? Yes ?Did it involve sudden or severe rash/hives, skin peeling, or any reaction on the inside o

## 2021-11-09 ENCOUNTER — Encounter: Payer: Self-pay | Admitting: Oncology

## 2021-11-09 ENCOUNTER — Inpatient Hospital Stay: Payer: Medicare HMO

## 2021-11-09 ENCOUNTER — Inpatient Hospital Stay (INDEPENDENT_AMBULATORY_CARE_PROVIDER_SITE_OTHER): Payer: Medicare HMO | Admitting: Oncology

## 2021-11-09 ENCOUNTER — Other Ambulatory Visit: Payer: Self-pay

## 2021-11-09 DIAGNOSIS — M810 Age-related osteoporosis without current pathological fracture: Secondary | ICD-10-CM | POA: Diagnosis not present

## 2021-11-09 DIAGNOSIS — D0512 Intraductal carcinoma in situ of left breast: Secondary | ICD-10-CM

## 2021-11-09 LAB — CBC AND DIFFERENTIAL
HCT: 43 (ref 36–46)
Hemoglobin: 13.7 (ref 12.0–16.0)
Neutrophils Absolute: 4.36
Platelets: 275 10*3/uL (ref 150–400)
WBC: 6.8

## 2021-11-09 LAB — HEPATIC FUNCTION PANEL
ALT: 22 U/L (ref 7–35)
AST: 24 (ref 13–35)
Alkaline Phosphatase: 102 (ref 25–125)
Bilirubin, Total: 0.5

## 2021-11-09 LAB — COMPREHENSIVE METABOLIC PANEL
Albumin: 4.3 (ref 3.5–5.0)
Calcium: 9.3 (ref 8.7–10.7)

## 2021-11-09 LAB — BASIC METABOLIC PANEL
BUN: 15 (ref 4–21)
CO2: 28 — AB (ref 13–22)
Chloride: 105 (ref 99–108)
Creatinine: 0.6 (ref 0.5–1.1)
Glucose: 153
Potassium: 3.8 mEq/L (ref 3.5–5.1)
Sodium: 139 (ref 137–147)

## 2021-11-09 LAB — CBC: RBC: 5.19 — AB (ref 3.87–5.11)

## 2021-11-15 ENCOUNTER — Encounter: Payer: Self-pay | Admitting: Genetic Counselor

## 2021-11-15 DIAGNOSIS — Z1379 Encounter for other screening for genetic and chromosomal anomalies: Secondary | ICD-10-CM

## 2021-11-15 HISTORY — DX: Encounter for other screening for genetic and chromosomal anomalies: Z13.79

## 2021-11-16 DIAGNOSIS — D0512 Intraductal carcinoma in situ of left breast: Secondary | ICD-10-CM | POA: Diagnosis not present

## 2021-11-16 DIAGNOSIS — Z170 Estrogen receptor positive status [ER+]: Secondary | ICD-10-CM | POA: Diagnosis not present

## 2021-11-16 DIAGNOSIS — Z510 Encounter for antineoplastic radiation therapy: Secondary | ICD-10-CM | POA: Diagnosis not present

## 2021-11-18 ENCOUNTER — Ambulatory Visit: Payer: Self-pay | Admitting: Genetic Counselor

## 2021-11-18 ENCOUNTER — Telehealth: Payer: Self-pay | Admitting: Genetic Counselor

## 2021-11-18 DIAGNOSIS — Z510 Encounter for antineoplastic radiation therapy: Secondary | ICD-10-CM | POA: Diagnosis not present

## 2021-11-18 DIAGNOSIS — Z1379 Encounter for other screening for genetic and chromosomal anomalies: Secondary | ICD-10-CM

## 2021-11-18 DIAGNOSIS — Z8042 Family history of malignant neoplasm of prostate: Secondary | ICD-10-CM

## 2021-11-18 DIAGNOSIS — Z170 Estrogen receptor positive status [ER+]: Secondary | ICD-10-CM | POA: Diagnosis not present

## 2021-11-18 DIAGNOSIS — D0512 Intraductal carcinoma in situ of left breast: Secondary | ICD-10-CM

## 2021-11-18 DIAGNOSIS — Z803 Family history of malignant neoplasm of breast: Secondary | ICD-10-CM

## 2021-11-18 NOTE — Telephone Encounter (Signed)
Revealed negative genetic testing.  Discussed that we do not know why she has breast cancer or why there is cancer in the family. It could be sporadic/famillial, due to a change in a gene that she did not inherit, due to a different gene that we are not testing, or maybe our current technology may not be able to pick something up.  It will be important for her to keep in contact with genetics to keep up with whether additional testing may be needed.     

## 2021-11-18 NOTE — Progress Notes (Signed)
HPI:   ?Amber Sexton was previously seen in the [facility] clinic due to a personal and family history of cancer and concerns regarding a hereditary predisposition to cancer. Please refer to our prior cancer genetics clinic note for more information regarding our discussion, assessment and recommendations, at the time. Amber Sexton recent genetic test results were disclosed to her, as were recommendations warranted by these results. These results and recommendations are discussed in more detail below. ? ?CANCER HISTORY:  ?Oncology History  ?Ductal carcinoma in situ (DCIS) of breast  ?10/31/2021 Initial Diagnosis  ? Ductal carcinoma in situ (DCIS) of breast ?  ?11/15/2021 Genetic Testing  ? Negative hereditary cancer genetic testing: no pathogenic variants detected in [facility] CancerNext-Expanded +RNAinsight Panel.  Report date is 11/15/2021.  ? ?The CancerNext-Expanded gene panel offered by [facility] and includes sequencing, rearrangement, and RNA analysis for the following 77 genes: AIP, ALK, APC, ATM, AXIN2, BAP1, BARD1, BLM, BMPR1A, BRCA1, BRCA2, BRIP1, CDC73, CDH1, CDK4, CDKN1B, CDKN2A, CHEK2, CTNNA1, DICER1, FANCC, FH, FLCN, GALNT12, KIF1B, LZTR1, MAX, MEN1, MET, MLH1, MSH2, MSH3, MSH6, MUTYH, NBN, NF1, NF2, NTHL1, PALB2, PHOX2B, PMS2, POT1, PRKAR1A, PTCH1, PTEN, RAD51C, RAD51D, RB1, RECQL, RET, SDHA, SDHAF2, SDHB, SDHC, SDHD, SMAD4, SMARCA4, SMARCB1, SMARCE1, STK11, SUFU, TMEM127, TP53, TSC1, TSC2, VHL and XRCC2 (sequencing and deletion/duplication); EGFR, EGLN1, HOXB13, KIT, MITF, PDGFRA, POLD1, and POLE (sequencing only); EPCAM and GREM1 (deletion/duplication only).  ?  ? ? ?FAMILY HISTORY:  ?We obtained a detailed, 4-generation family history.  Significant diagnoses are listed below: ?     ?Family History  ?Problem Relation Age of Onset  ? Breast cancer Sister 103  ? Prostate cancer Brother 37  ? Colon cancer Daughter 56  ? Uterine cancer Daughter 70  ?  ?  ?Amber Sexton is unaware of  previous family history of genetic testing for hereditary cancer risks. There is no reported Ashkenazi Jewish ancestry. There is reported consanguinity between her parents.  Degree of relation not reported.  ? ?GENETIC TEST RESULTS:  ?The [facility] CancerNext+RNAinsight Panel found no pathogenic mutations. The CancerNext-Expanded gene panel offered by [facility] and includes sequencing, rearrangement, and RNA analysis for the following 77 genes: AIP, ALK, APC, ATM, AXIN2, BAP1, BARD1, BLM, BMPR1A, BRCA1, BRCA2, BRIP1, CDC73, CDH1, CDK4, CDKN1B, CDKN2A, CHEK2, CTNNA1, DICER1, FANCC, FH, FLCN, GALNT12, KIF1B, LZTR1, MAX, MEN1, MET, MLH1, MSH2, MSH3, MSH6, MUTYH, NBN, NF1, NF2, NTHL1, PALB2, PHOX2B, PMS2, POT1, PRKAR1A, PTCH1, PTEN, RAD51C, RAD51D, RB1, RECQL, RET, SDHA, SDHAF2, SDHB, SDHC, SDHD, SMAD4, SMARCA4, SMARCB1, SMARCE1, STK11, SUFU, TMEM127, TP53, TSC1, TSC2, VHL and XRCC2 (sequencing and deletion/duplication); EGFR, EGLN1, HOXB13, KIT, MITF, PDGFRA, POLD1, and POLE (sequencing only); EPCAM and GREM1 (deletion/duplication only).  ? ?The test report has been scanned into EPIC and is located under the Molecular Pathology section of the Results Review tab.  A portion of the result report is included below for reference. Genetic testing reported out on November 15, 2021.  ? ? ? ?Even though a pathogenic variant was not identified, possible explanations for the cancer in the family may include: ?There may be no hereditary risk for cancer in the family. The cancers in Amber Sexton and/or her family may be sporadic/familial or due to other genetic and environmental factors. ?There may be a gene mutation in one of these genes that current testing methods cannot detect but that chance is small. ?There could be another gene that has not yet been discovered, or that we have not yet tested, that is responsible  for the cancer diagnoses in the family.  ?It is also possible there is a hereditary cause for the cancer in the  family that Amber Sexton did not inherit. ? ? ?Therefore, it is important to remain in touch with cancer genetics in the future so that we can continue to offer Amber Sexton the most up to date genetic testing.  ? ? ?ADDITIONAL GENETIC TESTING:  ?We discussed with Amber Sexton that her genetic testing was fairly extensive.  If there are genes identified to increase cancer risk that can be analyzed in the future, we would be happy to discuss and coordinate this testing at that time.   ? ?CANCER SCREENING RECOMMENDATIONS:  ?Amber Sexton's test result is considered negative (normal).  This means that we have not identified a hereditary cause for her personal history of DCIS at this time.  ? ?An individual's cancer risk and medical management are not determined by genetic test results alone. Overall cancer risk assessment incorporates additional factors, including personal medical history, family history, and any available genetic information that may result in a personalized plan for cancer prevention and surveillance. Therefore, it is recommended she continue to follow the cancer management and screening guidelines provided by her oncology and primary healthcare provider. ? ?RECOMMENDATIONS FOR FAMILY MEMBERS:   ?Since she did not inherit a identifiable mutation in a cancer predisposition gene included on this panel, her children could not have inherited a known mutation from her in one of these genes. ?Individuals in this family might be at some increased risk of developing cancer, over the general population risk, due to the family history of cancer.  Individuals in the family should notify their providers of the family history of cancer. We recommend women in this family have a yearly mammogram beginning at age 59, or 106 years younger than the earliest onset of cancer, an annual clinical breast exam, and perform monthly breast self-exams.   First degree relatives of those with colon cancer should receive  colonoscopies beginning at age 65, or 10 years prior to the earliest diagnosis of colon cancer in the family, and receive colonoscopies at least every 5 years, or as recommended by their gastroenterologist.   ?Other members of the family may still carry a pathogenic variant in one of these genes that Amber Sexton did not inherit. Her daughter's genetics results are pending.  ? ? ?FOLLOW-UP:  ?Lastly, we discussed with Amber Sexton that cancer genetics is a rapidly advancing field and it is possible that new genetic tests will be appropriate for her and/or her family members in the future. We encouraged her to remain in contact with cancer genetics on an annual basis so we can update her personal and family histories and let her know of advances in cancer genetics that may benefit this family.  ? ?Our contact number was provided. Amber Sexton questions were answered to her satisfaction, and she knows she is welcome to call us at anytime with additional questions or concerns.  ? ?Arora Coakley M. Joette Catching, [place], [place] ?Genetic Counselor ?[email or web address] ?([phone]) [phone] ? ?

## 2021-11-21 DIAGNOSIS — Z510 Encounter for antineoplastic radiation therapy: Secondary | ICD-10-CM | POA: Diagnosis not present

## 2021-11-21 DIAGNOSIS — Z170 Estrogen receptor positive status [ER+]: Secondary | ICD-10-CM | POA: Diagnosis not present

## 2021-11-21 DIAGNOSIS — D0512 Intraductal carcinoma in situ of left breast: Secondary | ICD-10-CM | POA: Diagnosis not present

## 2021-11-22 DIAGNOSIS — Z510 Encounter for antineoplastic radiation therapy: Secondary | ICD-10-CM | POA: Diagnosis not present

## 2021-11-22 DIAGNOSIS — D0512 Intraductal carcinoma in situ of left breast: Secondary | ICD-10-CM | POA: Diagnosis not present

## 2021-11-23 DIAGNOSIS — Z510 Encounter for antineoplastic radiation therapy: Secondary | ICD-10-CM | POA: Diagnosis not present

## 2021-11-23 DIAGNOSIS — D0512 Intraductal carcinoma in situ of left breast: Secondary | ICD-10-CM | POA: Diagnosis not present

## 2021-11-24 DIAGNOSIS — D0512 Intraductal carcinoma in situ of left breast: Secondary | ICD-10-CM | POA: Diagnosis not present

## 2021-11-24 DIAGNOSIS — Z510 Encounter for antineoplastic radiation therapy: Secondary | ICD-10-CM | POA: Diagnosis not present

## 2021-11-25 DIAGNOSIS — Z510 Encounter for antineoplastic radiation therapy: Secondary | ICD-10-CM | POA: Diagnosis not present

## 2021-11-25 DIAGNOSIS — D0512 Intraductal carcinoma in situ of left breast: Secondary | ICD-10-CM | POA: Diagnosis not present

## 2021-11-28 DIAGNOSIS — D0512 Intraductal carcinoma in situ of left breast: Secondary | ICD-10-CM | POA: Diagnosis not present

## 2021-11-28 DIAGNOSIS — Z510 Encounter for antineoplastic radiation therapy: Secondary | ICD-10-CM | POA: Diagnosis not present

## 2021-11-28 DIAGNOSIS — Z170 Estrogen receptor positive status [ER+]: Secondary | ICD-10-CM | POA: Diagnosis not present

## 2021-11-29 DIAGNOSIS — Z510 Encounter for antineoplastic radiation therapy: Secondary | ICD-10-CM | POA: Diagnosis not present

## 2021-11-29 DIAGNOSIS — Z170 Estrogen receptor positive status [ER+]: Secondary | ICD-10-CM | POA: Diagnosis not present

## 2021-11-29 DIAGNOSIS — D0512 Intraductal carcinoma in situ of left breast: Secondary | ICD-10-CM | POA: Diagnosis not present

## 2021-11-30 DIAGNOSIS — D0512 Intraductal carcinoma in situ of left breast: Secondary | ICD-10-CM | POA: Diagnosis not present

## 2021-11-30 DIAGNOSIS — Z510 Encounter for antineoplastic radiation therapy: Secondary | ICD-10-CM | POA: Diagnosis not present

## 2021-12-01 DIAGNOSIS — D0512 Intraductal carcinoma in situ of left breast: Secondary | ICD-10-CM | POA: Diagnosis not present

## 2021-12-01 DIAGNOSIS — Z510 Encounter for antineoplastic radiation therapy: Secondary | ICD-10-CM | POA: Diagnosis not present

## 2021-12-02 DIAGNOSIS — Z510 Encounter for antineoplastic radiation therapy: Secondary | ICD-10-CM | POA: Diagnosis not present

## 2021-12-02 DIAGNOSIS — D0512 Intraductal carcinoma in situ of left breast: Secondary | ICD-10-CM | POA: Diagnosis not present

## 2021-12-05 DIAGNOSIS — M810 Age-related osteoporosis without current pathological fracture: Secondary | ICD-10-CM | POA: Diagnosis not present

## 2021-12-05 DIAGNOSIS — J449 Chronic obstructive pulmonary disease, unspecified: Secondary | ICD-10-CM | POA: Diagnosis not present

## 2021-12-05 DIAGNOSIS — D0512 Intraductal carcinoma in situ of left breast: Secondary | ICD-10-CM | POA: Diagnosis not present

## 2021-12-05 DIAGNOSIS — Z510 Encounter for antineoplastic radiation therapy: Secondary | ICD-10-CM | POA: Diagnosis not present

## 2021-12-05 DIAGNOSIS — Z170 Estrogen receptor positive status [ER+]: Secondary | ICD-10-CM | POA: Diagnosis not present

## 2021-12-05 DIAGNOSIS — I2510 Atherosclerotic heart disease of native coronary artery without angina pectoris: Secondary | ICD-10-CM | POA: Diagnosis not present

## 2021-12-06 DIAGNOSIS — Z510 Encounter for antineoplastic radiation therapy: Secondary | ICD-10-CM | POA: Diagnosis not present

## 2021-12-06 DIAGNOSIS — D0512 Intraductal carcinoma in situ of left breast: Secondary | ICD-10-CM | POA: Diagnosis not present

## 2021-12-07 DIAGNOSIS — Z510 Encounter for antineoplastic radiation therapy: Secondary | ICD-10-CM | POA: Diagnosis not present

## 2021-12-07 DIAGNOSIS — D0512 Intraductal carcinoma in situ of left breast: Secondary | ICD-10-CM | POA: Diagnosis not present

## 2021-12-08 DIAGNOSIS — Z510 Encounter for antineoplastic radiation therapy: Secondary | ICD-10-CM | POA: Diagnosis not present

## 2021-12-08 DIAGNOSIS — D0512 Intraductal carcinoma in situ of left breast: Secondary | ICD-10-CM | POA: Diagnosis not present

## 2021-12-09 DIAGNOSIS — Z510 Encounter for antineoplastic radiation therapy: Secondary | ICD-10-CM | POA: Diagnosis not present

## 2021-12-09 DIAGNOSIS — D0512 Intraductal carcinoma in situ of left breast: Secondary | ICD-10-CM | POA: Diagnosis not present

## 2021-12-12 DIAGNOSIS — D0512 Intraductal carcinoma in situ of left breast: Secondary | ICD-10-CM | POA: Diagnosis not present

## 2021-12-12 DIAGNOSIS — Z510 Encounter for antineoplastic radiation therapy: Secondary | ICD-10-CM | POA: Diagnosis not present

## 2021-12-12 DIAGNOSIS — Z170 Estrogen receptor positive status [ER+]: Secondary | ICD-10-CM | POA: Diagnosis not present

## 2021-12-13 DIAGNOSIS — Z510 Encounter for antineoplastic radiation therapy: Secondary | ICD-10-CM | POA: Diagnosis not present

## 2021-12-13 DIAGNOSIS — D0512 Intraductal carcinoma in situ of left breast: Secondary | ICD-10-CM | POA: Diagnosis not present

## 2021-12-14 DIAGNOSIS — Z170 Estrogen receptor positive status [ER+]: Secondary | ICD-10-CM | POA: Diagnosis not present

## 2021-12-14 DIAGNOSIS — Z510 Encounter for antineoplastic radiation therapy: Secondary | ICD-10-CM | POA: Diagnosis not present

## 2021-12-14 DIAGNOSIS — D0512 Intraductal carcinoma in situ of left breast: Secondary | ICD-10-CM | POA: Diagnosis not present

## 2021-12-15 DIAGNOSIS — Z510 Encounter for antineoplastic radiation therapy: Secondary | ICD-10-CM | POA: Diagnosis not present

## 2021-12-15 DIAGNOSIS — D0512 Intraductal carcinoma in situ of left breast: Secondary | ICD-10-CM | POA: Diagnosis not present

## 2021-12-16 DIAGNOSIS — D0512 Intraductal carcinoma in situ of left breast: Secondary | ICD-10-CM | POA: Diagnosis not present

## 2021-12-16 DIAGNOSIS — Z510 Encounter for antineoplastic radiation therapy: Secondary | ICD-10-CM | POA: Diagnosis not present

## 2021-12-19 DIAGNOSIS — Z170 Estrogen receptor positive status [ER+]: Secondary | ICD-10-CM | POA: Diagnosis not present

## 2021-12-19 DIAGNOSIS — Z510 Encounter for antineoplastic radiation therapy: Secondary | ICD-10-CM | POA: Diagnosis not present

## 2021-12-19 DIAGNOSIS — D0512 Intraductal carcinoma in situ of left breast: Secondary | ICD-10-CM | POA: Diagnosis not present

## 2021-12-20 ENCOUNTER — Telehealth: Payer: Self-pay | Admitting: Oncology

## 2021-12-20 DIAGNOSIS — Z510 Encounter for antineoplastic radiation therapy: Secondary | ICD-10-CM | POA: Diagnosis not present

## 2021-12-20 DIAGNOSIS — D0512 Intraductal carcinoma in situ of left breast: Secondary | ICD-10-CM | POA: Diagnosis not present

## 2021-12-20 NOTE — Telephone Encounter (Signed)
Contacted pt to schedule an appt. Unable to reach via phone, voicemail was left. ? ?FW: f/u ?Received: Today ?Georgette Shell, RN  N[facility] Seat; [place], [place] ?Patient has completed radiation please schedule.  Thanks   ?  ?   ?Previous Messages ?  ?----- Message -----  ?From: Derwood Kaplan, MD  ?Sent: 11/09/2021   3:53 PM EDT  ?To: Georgette Shell, RN  ?Subject: f/u                                            ? ?I told pt and scheduling that I would like to see in 2-3 months, so end of May?   ?

## 2021-12-21 DIAGNOSIS — Z510 Encounter for antineoplastic radiation therapy: Secondary | ICD-10-CM | POA: Diagnosis not present

## 2021-12-21 DIAGNOSIS — D0512 Intraductal carcinoma in situ of left breast: Secondary | ICD-10-CM | POA: Diagnosis not present

## 2021-12-22 ENCOUNTER — Telehealth: Payer: Self-pay | Admitting: Oncology

## 2021-12-22 DIAGNOSIS — D0512 Intraductal carcinoma in situ of left breast: Secondary | ICD-10-CM | POA: Diagnosis not present

## 2021-12-22 DIAGNOSIS — Z510 Encounter for antineoplastic radiation therapy: Secondary | ICD-10-CM | POA: Diagnosis not present

## 2021-12-22 NOTE — Telephone Encounter (Signed)
Contacted pt to schedule an appt. Unable to reach via phone, voicemail was left. ? ? ?FW: f/u ?Received: 2 days ago ?Georgette Shell, RN  [facility]; [place], [place] ?Patient has completed radiation please schedule.  Thanks   ?  ?   ?Previous Messages ?  ?----- Message -----  ?From: Derwood Kaplan, MD  ?Sent: 11/09/2021   3:53 PM EDT  ?To: Georgette Shell, RN  ?Subject: f/u                                            ? ?I told pt and scheduling that I would like to see in 2-3 months, so end of May?   ?

## 2021-12-23 DIAGNOSIS — Z170 Estrogen receptor positive status [ER+]: Secondary | ICD-10-CM | POA: Diagnosis not present

## 2021-12-23 DIAGNOSIS — Z510 Encounter for antineoplastic radiation therapy: Secondary | ICD-10-CM | POA: Diagnosis not present

## 2021-12-23 DIAGNOSIS — D0512 Intraductal carcinoma in situ of left breast: Secondary | ICD-10-CM | POA: Diagnosis not present

## 2021-12-29 ENCOUNTER — Telehealth: Payer: Self-pay | Admitting: Oncology

## 2021-12-29 NOTE — Telephone Encounter (Signed)
Contacted pt to schedule an appt. Unable to reach via phone, voicemail was left. ? ? ?FW: f/u ?Received: 1 week ago ?Georgette Shell, RN sent to [facility] Seat; [place], [place] ?Patient has completed radiation please schedule.  Thanks   ?  ?   ?Previous Messages ?  ?----- Message -----  ?From: Derwood Kaplan, MD  ?Sent: 11/09/2021   3:53 PM EDT  ?To: Georgette Shell, RN  ?Subject: f/u                                            ? ?I told pt and scheduling that I would like to see in 2-3 months, so end of May?  ?

## 2022-01-25 ENCOUNTER — Other Ambulatory Visit: Payer: Self-pay | Admitting: Oncology

## 2022-01-25 DIAGNOSIS — D0512 Intraductal carcinoma in situ of left breast: Secondary | ICD-10-CM

## 2022-01-25 NOTE — Progress Notes (Signed)
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  Her prognosis is great and I have discussed the diagnosis with her.  We also discussed the concept of chemoprevention and the risks and benefits.  She is in agreement to try raloxifene and I will prescribe 60 mg daily.  She will call if she has any questions or problems with this medication.  Osteoporosis This was confirmed on a bone density scan of August 31, 2021.  She is on vitamin D weekly and calcium twice daily.  Hopefully the raloxifene will help her bones as well.  Strong family history of breast cancer and prostate cancer She has met with the genetic counselor on March 3 and genetic testing was sent to the [facility].  Results are negative.   We will start her on raloxifene for chemoprevention and I once again reviewed the rationale and potential toxicities.  Since the surgeon will be seeing her for check in 3 months, I will plan to see her back in 6 months for reexamination.  I discussed the assessment and treatment plan with the patient.  The patient was provided an opportunity to ask questions and all were answered.  The patient agreed with the plan and demonstrated an understanding of the instructions.  The patient was advised to call back if the symptoms worsen or if the condition fails to improve as anticipated.  I provided 15 minutes of face-to-face time during this this encounter and > 50% was spent counseling as documented under my assessment and plan.    Amber Kaplan, MD [facility] AT [facility] [address] [place] [address] Dept: [phone] Dept Fax: [phone]   Amber Kaplan, MD   6/29/20237:24 PM  CHIEF COMPLAINT:  CC: New ductal carcinoma in situ  Current Treatment: Chemoprevention with raloxifene   HISTORY OF PRESENT ILLNESS:  Amber Sexton is a 66 y.o. female with newly diagnosed ductal carcinoma in situ of the left breast who is referred in consultation from Dr. Orrin Brigham for assessment and management.  This was found on a screening mammogram which was performed on December 2 and revealed possible masses of the left breast.  She came back for diagnostic mammogram on September 14, 2021 and this confirmed isodense masses measuring 9 mm at 12 o'clock and another at 3 o'clock, measuring 8 mm.  An ultrasound was performed that same day and confirmed that the mass at 12 o'clock was mixed cystic and solid and indeterminant, so a biopsy was recommended. This measured approximately 1.1 cm.  The lesion at 3 o'clock was felt to be a benign cyst measuring 0.8 cm.  A biopsy was performed of the suspicious lesion on January 26 and revealed ductal carcinoma in situ.  She had surgery on February 21 with lumpectomy and clear margins were obtained.  The final pathology revealed a ductal carcinoma in situ with micropapillary and cribriform patterns measuring 12 mm and grade 1.  Estrogen and progesterone receptors were both highly positive at 100%.  She has also had a bone density scan on August 31, 2021, which revealed osteoporosis of the femur with a T score of -3.0 and osteopenia of the spine  with a T score measuring -2.2.  She does take calcium twice daily and has been placed on high-dose vitamin D weekly. Her last mammogram had been about 5 years ago and so she was not getting this yearly.  She had menarche at age 3 and had surgical menopause at age 15 with a partial hysterectomy for dysfunctional uterine bleeding.  This was performed after multiple other hormonal maneuvers were unsuccessful.  Her ovaries were left intact.  She was not placed on hormone replacement therapy.   She had her first birth at age 30.  INTERVAL HISTORY:  I have reviewed her chart and materials related to her cancer extensively and collaborated history with the patient. Summary of oncologic history is as follows: Oncology History  Ductal carcinoma in situ (DCIS) of breast  10/18/2021 Cancer Staging   Staging form: Breast, AJCC 8th Edition - Clinical stage from 10/18/2021: Stage 0 (cTis (DCIS), cN0, cM0, G1, ER+, PR+, HER2: Not Assessed) - Signed by Amber Kaplan, MD on 11/20/2021 Histopathologic type: Intraductal carcinoma, noninfiltrating, NOS Stage prefix: Initial diagnosis Method of lymph node assessment: Clinical Nuclear grade: G1 Multigene prognostic tests performed: None Histologic grading system: 3 grade system Laterality: Left Tumor size (mm): 12 Lymph-vascular invasion (LVI): LVI not present (absent)/not identified Diagnostic confirmation: Positive histology PLUS positive immunophenotyping and/or positive genetic studies Specimen type: Excision Staged by: Managing physician Menopausal status: Postmenopausal Circulating tumor cells (CTC): Not assessed Stage used in treatment planning: Yes National guidelines used in treatment planning: Yes Type of national guideline used in treatment planning: NCCN   10/31/2021 Initial Diagnosis   Ductal carcinoma in situ (DCIS) of breast   11/15/2021 Genetic Testing   Negative hereditary cancer genetic testing: no pathogenic variants detected in [facility] CancerNext-Expanded +RNAinsight Panel.  Report date is 11/15/2021.   The CancerNext-Expanded gene panel offered by [facility] and includes sequencing, rearrangement, and RNA analysis for the following 77 genes: AIP, ALK, APC, ATM, AXIN2, BAP1, BARD1, BLM, BMPR1A, BRCA1, BRCA2, BRIP1, CDC73, CDH1, CDK4, CDKN1B, CDKN2A, CHEK2, CTNNA1, DICER1, FANCC, FH, FLCN, GALNT12, KIF1B, LZTR1, MAX, MEN1, MET, MLH1, MSH2, MSH3, MSH6, MUTYH, NBN, NF1, NF2, NTHL1, PALB2, PHOX2B, PMS2, POT1, PRKAR1A,  PTCH1, PTEN, RAD51C, RAD51D, RB1, RECQL, RET, SDHA, SDHAF2, SDHB, SDHC, SDHD, SMAD4, SMARCA4, SMARCB1, SMARCE1, STK11, SUFU, TMEM127, TP53, TSC1, TSC2, VHL and XRCC2 (sequencing and deletion/duplication); EGFR, EGLN1, HOXB13, KIT, MITF, PDGFRA, POLD1, and POLE (sequencing only); EPCAM and GREM1 (deletion/duplication only).      Amber Sexton is seen in the clinic for follow up of her ductal carcinoma in situ.  She completed radiation last month and is here to discuss chemoprevention.  I discussed the schedule and potential toxicities as well as the rationale for hormonal therapy for prevention of breast cancer.  She also has osteoporosis and so we will prescribe raloxifene 60 mg daily and she agrees.  She met with the genetic counselor on March 3 and testing was negative for any clinically significant mutation. CBC and comprehensive metabolic profile from today are unremarkable other than a nonfasting blood sugar of 168.  She is up-to-date on colonoscopy and will be due at 5 years.  She denies fever, chills, night sweats, or other signs of infection. She denies cardiorespiratory and gastrointestinal issues. She  denies pain. Her appetite is good. Her weight has been stable.  Her surgeon will be seeing her in 3 months with mammography. HISTORY:   Past Medical History:  Diagnosis Date   Abnormal nuclear stress test 06/02/2019   Angina pectoris ([place])  06/02/2019   Arthritis    CAD (coronary artery disease) 06/09/2019   Chest pain 07/21/2015   COPD (chronic obstructive pulmonary disease) ([place])    Ductal carcinoma in situ (DCIS) of breast 10/31/2021   Dyspnea on exertion 04/28/2019   Essential hypertension 07/21/2015   Ex-cigarette smoker 07/21/2015   Family history of breast cancer 10/31/2021   Family history of prostate cancer 10/31/2021   Meniere disease    Mixed dyslipidemia 03/02/2021   Morbid obesity ([place]) 11/03/2019   Obesity (BMI 35.0-39.9 without comorbidity) 07/20/2020   Pulmonary hypertension ([place])     Sleep apnea 04/28/2019    Past Surgical History:  Procedure Laterality Date   ABDOMINAL HYSTERECTOMY     CORONARY STENT INTERVENTION N/A 06/09/2019   Procedure: CORONARY STENT INTERVENTION;  Surgeon: Belva Crome, MD;  Location: [facility];  Service: Cardiovascular;  Laterality: N/A;   LEFT HEART CATH AND CORONARY ANGIOGRAPHY N/A 06/09/2019   Procedure: LEFT HEART CATH AND CORONARY ANGIOGRAPHY;  Surgeon: Belva Crome, MD;  Location: [facility];  Service: Cardiovascular;  Laterality: N/A;   TONSILLECTOMY      Family History  Problem Relation Age of Onset   Diabetes Sister    Breast cancer Sister 24   Hypertension Sister    Diabetes Brother    Hypertension Brother    Diabetes Brother    Prostate cancer Brother 66   Hypertension Brother    Colon cancer Daughter 27   Uterine cancer Daughter 50    Social History:  reports that she quit smoking about 9 years ago. Her smoking use included cigarettes. She smoked an average of 1 pack per day. She has never used smokeless tobacco. She reports that she does not drink alcohol and does not use drugs.The patient is accompanied by her daughter Amber Sexton today.  She does have 2 children.  She is divorced and disabled.  She previously worked at [employer].  Allergies:  Allergies  Allergen Reactions   Ampicillin Anaphylaxis    Did it involve swelling of the face/tongue/throat, SOB, or low BP? Yes Did it involve sudden or severe rash/hives, skin peeling, or any reaction on the inside of your mouth or nose? No Did you need to seek medical attention at a hospital or doctor's office? Yes When did it last happen?     several years ago  If all above answers are "NO", may proceed with cephalosporin use.    Rosuvastatin Diarrhea and Other (See Comments)    Abdominal cramping   Pitavastatin Diarrhea    Pt also had abd pain.    Current Medications: Current Outpatient Medications  Medication Sig Dispense Refill   alendronate (FOSAMAX)  70 MG tablet Take 70 mg by mouth once a week.     ANORO ELLIPTA 62.5-25 MCG/INH AEPB Inhale 1 puff into the lungs daily.     aspirin EC 81 MG tablet Take 81 mg by mouth daily.     atorvastatin (LIPITOR) 10 MG tablet Take 10 mg by mouth daily.     Calcium Carb-Cholecalciferol (CALCIUM 600 + D PO) Take by mouth.     lansoprazole (PREVACID) 30 MG capsule Take 30 mg by mouth daily.     LUMIGAN 0.01 % SOLN Place 1 drop into both eyes daily.     nitroGLYCERIN (NITROSTAT) 0.4 MG SL tablet Place 0.4 mg under the tongue every 5 (five) minutes as needed for chest pain.     oxyCODONE (OXY IR/ROXICODONE) 5 MG immediate release tablet Take  by mouth.     raloxifene (EVISTA) 60 MG tablet Take 1 tablet (60 mg total) by mouth daily. 30 tablet 5   traMADol (ULTRAM) 50 MG tablet Take 50 mg by mouth every 4 (four) hours as needed.     No current facility-administered medications for this visit.    REVIEW OF SYSTEMS:  Review of Systems  Constitutional: Negative.   HENT:  Negative.    Eyes: Negative.   Respiratory:         Dyspnea with exertion  Cardiovascular: Negative.   Gastrointestinal: Negative.   Endocrine: Negative.   Genitourinary: Negative.    Musculoskeletal: Negative.   Skin: Negative.   Neurological: Negative.   Hematological: Negative.   Psychiatric/Behavioral: Negative.       VITALS:  Blood pressure 134/64, pulse 93, temperature 97.8 F (36.6 C), temperature source Oral, resp. rate 18, height 5' 2.75" (1.594 m), weight 230 lb 9.6 oz (104.6 kg), SpO2 95 %.  Wt Readings from Last 3 Encounters:  01/26/22 230 lb 9.6 oz (104.6 kg)  11/09/21 231 lb 1.6 oz (104.8 kg)  08/31/21 230 lb 3.2 oz (104.4 kg)    Body mass index is 41.18 kg/m.  Performance status (ECOG): 0 - Asymptomatic  PHYSICAL EXAM:  Physical Exam Constitutional:      Appearance: Normal appearance. She is obese.  HENT:     Head: Normocephalic and atraumatic.     Nose: Nose normal.  Eyes:     Extraocular Movements:  Extraocular movements intact.     Conjunctiva/sclera: Conjunctivae normal.     Pupils: Pupils are equal, round, and reactive to light.  Cardiovascular:     Rate and Rhythm: Normal rate and regular rhythm.     Heart sounds: Normal heart sounds.  Pulmonary:     Effort: Pulmonary effort is normal.     Breath sounds: Normal breath sounds.  Chest:  Breasts:    Right: Normal.     Left: Normal.     Comments: She has a small well-healed incision in the upper outer quadrant of the left breast. Abdominal:     General: Bowel sounds are normal.     Palpations: Abdomen is soft.  Musculoskeletal:        General: Normal range of motion.     Cervical back: Normal range of motion and neck supple.  Skin:    General: Skin is warm and dry.  Neurological:     General: No focal deficit present.     Mental Status: She is alert and oriented to person, place, and time.  Psychiatric:        Mood and Affect: Mood normal.        Behavior: Behavior normal.        Thought Content: Thought content normal.        Judgment: Judgment normal.    LABS:      Latest Ref Rng & Units 01/26/2022   12:00 AM 11/09/2021   12:00 AM 03/07/2021   10:54 AM  CBC  WBC  7.0     6.8     6.3   Hemoglobin 12.0 - 16.0 13.3     13.7     13.8   Hematocrit 36 - 46 42     43     42.8   Platelets 150 - 400 K/uL 267     275     243      This result is from an external source.      Latest  Ref Rng & Units 01/26/2022   12:00 AM 11/09/2021   12:00 AM 04/20/2021    9:43 AM  CMP  BUN 4 - $R'21 14     15       'PP$ Creatinine 0.5 - 1.1 0.7     0.6       Sodium 137 - 147 140     139       Potassium 3.5 - 5.1 mEq/L 3.9     3.8       Chloride 99 - 108 101     105       CO2 13 - $Re'22 31     28       'JvU$ Calcium 8.7 - 10.7 9.6     9.3       Total Protein 6.0 - 8.5 g/dL   6.6   Total Bilirubin 0.0 - 1.2 mg/dL   0.3   Alkaline Phos 25 - 125 94     102     133   AST 13 - 35 $Re'29     24     12   'BVv$ ALT 7 - 35 U/L $Remo'30     22     12      'Uwbjx$ This result is  from an external source.     STUDIES:     EXAM: 07/29/21 DIGITAL SCREENING BILATERAL MAMMOGRAM WITH TOMOSYNTHESIS AND CAD  TECHNIQUE:  Bilateral screening digital craniocaudal and mediolateral oblique  mammograms were obtained. Bilateral screening digital breast  tomosynthesis was performed. The images were evaluated with  computer-aided detection.  COMPARISON: Previous exam(s).  ACR Breast Density Category c: The breast tissue is heterogeneously  dense, which may obscure small masses.  FINDINGS:  In the left breast possible masses warrant further evaluation. In  the right breast, no findings suspicious for malignancy.  IMPRESSION:  Further evaluation is suggested for a possible masses in the left  breast.  RECOMMENDATION:  Diagnostic mammogram and possibly ultrasound of the left breast.  (Code:[id number]-56M)  The patient will be contacted regarding the findings, and additional  imaging will be scheduled.  BI-RADS CATEGORY 0: Incomplete. Need additional imaging evaluation  and/or prior mammograms for comparison.  Electronically Signed  By: Lovey Newcomer M.D.  On: 08/01/2021 12:22     EXAM: 09/14/21 DIGITAL DIAGNOSTIC UNILATERAL LEFT MAMMOGRAM WITH TOMOSYNTHESIS AND  CAD; ULTRASOUND LEFT BREAST  TECHNIQUE:  Left digital diagnostic mammography and breast tomosynthesis was  performed. The images were evaluated with computer-aided detection.;  Targeted ultrasound examination of the left breast was performed.  COMPARISON: Previous exam(s).  ACR Breast Density Category c: The breast tissue is heterogeneously  dense, which may obscure small masses.  FINDINGS:  Spot-compression CC and MLO views of the areas of concern and a full  field mediolateral view were obtained.  Spot compression images confirm what are either adjacent isodense  masses or clustered isodense masses in the upper retroareolar  location at anterior depth, measuring in total approximately 9 mm.  There is no  associated architectural distortion or suspicious  calcifications.  The mass questioned in the retroareolar location at posterior depth,  visible only on the screening MLO view, is shown on the spot  compression view to represent a circumscribed isodense mass  measuring approximately 8 mm. There is no associated architectural  distortion or suspicious calcifications. On the full field  mediolateral view, the mass is located more inferiorly relative to  its location on the MLO view, indicating its location in the outer  breast, therefore at or near 3 o'clock.   Targeted ultrasound is performed, demonstrating a mixed cystic and  solid mass at the 12 o'clock position 1 cm from the nipple measuring  approximately 1.1 x 0.6 x 0.6 cm, demonstrating no posterior  characteristics and no internal power Doppler flow, accounting for  the screening mammographic finding in the upper retroareolar  location.  At the 3 o'clock position 2 cm from the nipple at posterior depth is  an oval circumscribed parallel anechoic mass measuring approximately  0.8 x 0.4 x 0.7 cm, demonstrating posterior acoustic enhancement and  no internal power Doppler flow, accounting for the screening  mammographic finding.  IMPRESSION:  1. Indeterminate 1.1 cm complicated mixed cystic and solid mass in  the upper retroareolar location at 12 o'clock 1 cm from the nipple.  2. Benign simple cyst in the outer breast at posterior depth at the  3 o'clock position 2 cm from the nipple.   RECOMMENDATION:  Ultrasound-guided core needle biopsy of the indeterminate mass at 12  o'clock 1 cm from the nipple.  I have discussed the findings and recommendations with the patient.  The ultrasound core needle biopsy procedure was discussed with the  patient and her questions were answered. She wishes to proceed with  the biopsy which will be scheduled by the Breast Imaging staff.  BI-RADS CATEGORY 4: Suspicious.  Electronically Signed   By: Evangeline Dakin M.D.  On: 09/14/2021 11:33     Exam(s): [id number] DEXA/DG DEXA  EXAM: 08/31/21 DUAL X-RAY ABSORPTIOMETRY (DXA) FOR BONE MINERAL DENSITY  IMPRESSION:  Amber Sexton completed a BMD test on 08/31/2021 using the [place] (analysis version: 13.60) manufactured by [employer]. The following summarizes the results of our evaluation.  Technologist: [facility]  PATIENT BIOGRAPHICAL:  Name: Amber Sexton, Amber Sexton  Patient ID: [id number] [facility] Birth Date: 07-29-1956 Height: 64.0 in.  Gender: Female Exam Date: 08/31/2021 Weight: 230.0 lbs.  Indications: Fractures: Treatments:  ASSESSMENT:  The BMD measured at Femur Neck Left is 0.624 g/cm2 with a T-score of  -3.0. This patient is considered osteoporotic according to [facility]) criteria.  The scan quality is good. Exclusions: L1 due to degenerative  changes.  Site Region Measured Measured WHO Young Adult BMD  Date Age Classification T-score  AP Spine L2-L4 08/31/2021 64.1 Osteopenia -2.2 0.941 g/cm2  DualFemur Neck Left 08/31/2021 64.1 Osteoporosis -3.0 0.624 g/cm2  World Health Organization [facility]) criteria for post-menopausal,  Caucasian Women:  Normal: T-score at or above -1 SD  Osteopenia: T-score between -1 and -2.5 SD  Osteoporosis: T-score at or below -2.5 SD  RECOMMENDATIONS:  1. All patients should optimize calcium and vitamin D intake.  2. Consider FDA- approved medical therapies in post menopausal women  and men aged 44 years and older, based on the following:  a. A hip or vertebral (clinical or morphometric) fracture.  b. T- score < 2.5 of the femoral neck or spine after appropriate  evaluation to exclude secondary causes.  c. Low bone mass (T- score between -1.0 and -2.5 at femoral neck or  spine) and a 10 -year probability of a hip  fracture > 3% or a 10 -year probability of a major osteoporosis-  related fracture > 20% based on the [place]- adapted  WHO algorithm.  d. Clinician judgement  and/ or patient preferences may indicate  treatment for people with 10- year fracture probabilities above or  below these levels.   FOLLOW-UP:  People with diagnosed cases  of osteoporosis or at high risk for  fracture should have regular bone mineral density tests. For  patients eligible for Medicare, routine testing is allowed once  every 2 years. The testing frequency can be increased to one year  for patients who have rapidly progressing disease, those who are  receiving medical therapy to restore bone mass, or have additional  risk factors.  I have reviewed this report and agree with the above findings.  Electronically Signed  By: Elmer Picker M.D.  On: 08/31/2021 16:04  Electronically   PATHOLOGY  09/22/21  Diagnosis  Breast, left, needle core biopsy, left breast biopsy 12:00 1 cm from nipple with ribbon clip Ductal carcinoma in situ with calcifications  Microscopic comment By immunohistochemistry, the ductal epithelium is negative for cytokeratin 5/6 and shows strong positive ER expression. Cytokeratin AE1/AE3 does not highlight an infiltrative epithelial component. Based on the biopsy, the ductal carcinoma in situ has a papillary pattern, low nuclear grade and measures 0.2 cm in greatest linear extent.  Estrogen receptor: 100%, positive, strong staining intensity Progesterone receptor: 100%, positive, strong staining intensity  Amber Sheller, MD    PATHOLOGY 10/18/21  Breast, lumpectomy, left mass Ductal carcinoma in situ grade 1, adjacent to ribbon clip biopsy site. DCIS is 1 mm from closest (medial) margin. All margins negative for DCIS. No invasive carcinoma identified.  Breast, excision, additional anterior medial margin Fibrocystic changes. No atypia, in situ or invasive malignancy identified. All margins negative for DCIS.   Microscopic comment:  DCIS of the breast resection Amber Sexton laterality: Left Histologic type: Ductal carcinoma in situ,  micropapillary and cribriform patterns Size of DCIS: 12 mm Nuclear grade: Grade 1 (low) Necrosis: Not identified Margins: All margins negative for DCIS Specify closest margin: Medial 1 mm Regional lymph nodes: No regional lymph nodes submitted or found Breast biomarker testing performed on previous biopsy: Yes Testing performed on Case number: [id number] Estrogen receptor: 100% positive, strong staining intensity Progesterone receptor: 100%, positive, strong staining intensity Pathologic stage classification: pTis, pN not assigned  comment: CK5/6, ER and E-cadherin immunostains are utilized to evaluate the lesion.  Amber Lasso MD

## 2022-01-26 ENCOUNTER — Inpatient Hospital Stay: Payer: Medicare HMO | Attending: Hematology and Oncology | Admitting: Oncology

## 2022-01-26 ENCOUNTER — Inpatient Hospital Stay: Payer: Medicare HMO

## 2022-01-26 ENCOUNTER — Encounter: Payer: Self-pay | Admitting: Oncology

## 2022-01-26 ENCOUNTER — Telehealth: Payer: Self-pay | Admitting: Oncology

## 2022-01-26 ENCOUNTER — Other Ambulatory Visit: Payer: Self-pay | Admitting: Oncology

## 2022-01-26 VITALS — BP 134/64 | HR 93 | Temp 97.8°F | Resp 18 | Ht 62.75 in | Wt 230.6 lb

## 2022-01-26 DIAGNOSIS — D0512 Intraductal carcinoma in situ of left breast: Secondary | ICD-10-CM

## 2022-01-26 DIAGNOSIS — H401131 Primary open-angle glaucoma, bilateral, mild stage: Secondary | ICD-10-CM | POA: Diagnosis not present

## 2022-01-26 DIAGNOSIS — H353133 Nonexudative age-related macular degeneration, bilateral, advanced atrophic without subfoveal involvement: Secondary | ICD-10-CM | POA: Diagnosis not present

## 2022-01-26 DIAGNOSIS — D649 Anemia, unspecified: Secondary | ICD-10-CM | POA: Diagnosis not present

## 2022-01-26 LAB — CBC AND DIFFERENTIAL
HCT: 42 (ref 36–46)
Hemoglobin: 13.3 (ref 12.0–16.0)
Neutrophils Absolute: 4.83
Platelets: 267 10*3/uL (ref 150–400)
WBC: 7

## 2022-01-26 LAB — HEPATIC FUNCTION PANEL
ALT: 30 U/L (ref 7–35)
AST: 29 (ref 13–35)
Alkaline Phosphatase: 94 (ref 25–125)
Bilirubin, Total: 0.6

## 2022-01-26 LAB — BASIC METABOLIC PANEL
BUN: 14 (ref 4–21)
CO2: 31 — AB (ref 13–22)
Chloride: 101 (ref 99–108)
Creatinine: 0.7 (ref 0.5–1.1)
Glucose: 168
Potassium: 3.9 mEq/L (ref 3.5–5.1)
Sodium: 140 (ref 137–147)

## 2022-01-26 LAB — COMPREHENSIVE METABOLIC PANEL
Albumin: 4.4 (ref 3.5–5.0)
Calcium: 9.6 (ref 8.7–10.7)

## 2022-01-26 LAB — CBC: RBC: 5.06 (ref 3.87–5.11)

## 2022-01-26 MED ORDER — RALOXIFENE HCL 60 MG PO TABS: 60.0000 mg | ORAL_TABLET | Freq: Every day | ORAL | 5 refills | Status: DC

## 2022-01-26 NOTE — Telephone Encounter (Signed)
Per 01/26/22 los next appt scheduled and confirmed with patient

## 2022-03-08 DIAGNOSIS — D0512 Intraductal carcinoma in situ of left breast: Secondary | ICD-10-CM | POA: Diagnosis not present

## 2022-03-16 DIAGNOSIS — H353133 Nonexudative age-related macular degeneration, bilateral, advanced atrophic without subfoveal involvement: Secondary | ICD-10-CM | POA: Diagnosis not present

## 2022-03-16 DIAGNOSIS — H401131 Primary open-angle glaucoma, bilateral, mild stage: Secondary | ICD-10-CM | POA: Diagnosis not present

## 2022-05-03 ENCOUNTER — Other Ambulatory Visit: Payer: Self-pay

## 2022-05-03 ENCOUNTER — Other Ambulatory Visit: Payer: Self-pay | Admitting: Cardiology

## 2022-05-03 MED ORDER — NITROGLYCERIN 0.4 MG SL SUBL: 0.4000 mg | SUBLINGUAL_TABLET | SUBLINGUAL | 2 refills | Status: DC | PRN

## 2022-05-24 ENCOUNTER — Other Ambulatory Visit: Payer: Self-pay

## 2022-05-31 ENCOUNTER — Encounter: Payer: Self-pay | Admitting: Cardiology

## 2022-05-31 ENCOUNTER — Ambulatory Visit: Payer: Medicare HMO | Attending: Cardiology | Admitting: Cardiology

## 2022-05-31 VITALS — BP 178/95 | HR 90 | Ht 64.0 in | Wt 232.2 lb

## 2022-05-31 DIAGNOSIS — E782 Mixed hyperlipidemia: Secondary | ICD-10-CM | POA: Diagnosis not present

## 2022-05-31 DIAGNOSIS — I2510 Atherosclerotic heart disease of native coronary artery without angina pectoris: Secondary | ICD-10-CM | POA: Diagnosis not present

## 2022-05-31 DIAGNOSIS — Z87891 Personal history of nicotine dependence: Secondary | ICD-10-CM | POA: Diagnosis not present

## 2022-05-31 DIAGNOSIS — G4730 Sleep apnea, unspecified: Secondary | ICD-10-CM

## 2022-05-31 DIAGNOSIS — I10 Essential (primary) hypertension: Secondary | ICD-10-CM | POA: Diagnosis not present

## 2022-05-31 NOTE — Patient Instructions (Signed)

## 2022-05-31 NOTE — Progress Notes (Signed)
Cardiology Office Note:    Date:  05/31/2022   ID:  Amber Sexton, DOB 22-Apr-1956, MRN [id number]  PCP:  Amber Sexton  Cardiologist:  Amber Lindau, MD   Referring MD: Amber [place]:    1. Coronary artery disease involving native coronary artery of native heart without angina pectoris   2. Essential hypertension   3. Ex-cigarette smoker   4. Sleep apnea, unspecified type   5. Morbid obesity ([place])   6. Mixed dyslipidemia    PLAN:    In order of problems listed above:  Coronary disease: Secondary prevention stressed with the patient.  Importance of compliance with diet medication stressed and she vocalized understanding.  She was advised to walk at least half an hour a day 5 days a week and she promises to do so. Essential hypertension: Blood pressure stable and diet was emphasized.  She mentions to me that she has an element of whitecoat hypertension.  She mentions home blood pressure readings and they are fine. Lipidemia: On lipid-lowering medications followed by primary care.  Diet emphasized.  Lipids reviewed. Morbid obesity: Weight reduction stressed diet emphasized and she promises to do Sexton. Sleep: Sleep health issues were discussed extensively. Patient will be seen in follow-up appointment in 6 months or earlier if the patient has any concerns    Medication Adjustments/Labs and Tests Ordered: Current medicines are reviewed at length with the patient today.  Concerns regarding medicines are outlined above.  No orders of the defined types were placed in this encounter.  No orders of the defined types were placed in this encounter.    No chief complaint on file.    History of Present Illness:    Amber Sexton is a 66 y.o. female.  Patient has past medical history of coronary artery disease, essential hypertension, mixed dyslipidemia, sleep apnea and morbid obesity.  She has had breast cancer and has had treatment  and currently is cancer free.  She denies any chest pain orthopnea or PND.  She admits to a sedentary lifestyle.  At the time of my evaluation, the patient is alert awake oriented and in no distress.  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  Report date is 11/15/2021.   The CancerNext-Expanded gene panel offered by [facility] and includes sequencing, rearrangement, and RNA analysis for the following 77 genes: AIP, ALK, APC, ATM, AXIN2, BAP1, BARD1, BLM, BMPR1A, BRCA1, BRCA2, BRIP1, CDC73, CDH1, CDK4, CDKN1B,    Meniere disease    Mixed dyslipidemia 03/02/2021   Morbid obesity ([place]) 11/03/2019   Obesity (BMI 35.0-39.9 without comorbidity) 07/20/2020   Osteoporosis 08/31/2021   Pulmonary hypertension ([place])    Sleep apnea 04/28/2019    Past Surgical History:  Procedure Laterality Date   ABDOMINAL HYSTERECTOMY     CORONARY STENT INTERVENTION N/A 06/09/2019   Procedure: CORONARY STENT INTERVENTION;  Surgeon: Amber Crome,  MD;  Location: [facility];  Service: Cardiovascular;  Laterality: N/A;   LEFT HEART CATH AND CORONARY ANGIOGRAPHY N/A 06/09/2019   Procedure: LEFT HEART CATH AND CORONARY ANGIOGRAPHY;  Surgeon: Amber Crome, MD;  Location: [facility];  Service: Cardiovascular;  Laterality: N/A;   TONSILLECTOMY      Current Medications: Current Meds  Medication Sig    alendronate (FOSAMAX) 70 MG tablet Take 70 mg by mouth once a week.   ANORO ELLIPTA 62.5-25 MCG/INH AEPB Inhale 1 puff into the lungs daily.   aspirin EC 81 MG tablet Take 81 mg by mouth daily.   atorvastatin (LIPITOR) 10 MG tablet TAKE 1 TABLET BY MOUTH DAILY   Calcium Carb-Cholecalciferol (CALCIUM 600 + D PO) Take 1 tablet by mouth daily.   lansoprazole (PREVACID) 30 MG capsule Take 30 mg by mouth daily.   LUMIGAN 0.01 % SOLN Place 1 drop into both eyes daily.   nitroGLYCERIN (NITROSTAT) 0.4 MG SL tablet Place 1 tablet (0.4 mg total) under the tongue every 5 (five) minutes as needed for chest pain.   oxyCODONE (OXY IR/ROXICODONE) 5 MG immediate release tablet Take 5 mg by mouth as needed for pain.   traMADol (ULTRAM) 50 MG tablet Take 50 mg by mouth every 4 (four) hours as needed for pain.     Allergies:   Ampicillin, Rosuvastatin, and Pitavastatin   Social History   Socioeconomic History   Marital status: Divorced    Spouse name: Not on file   Number of children: Not on file   Years of education: Not on file   Highest education level: Not on file  Occupational History   Not on file  Tobacco Use   Smoking status: Former    Packs/day: 1.00    Types: Cigarettes    Quit date: 2014    Years since quitting: 9.7   Smokeless tobacco: Never  Vaping Use   Vaping Use: Every day  Substance and Sexual Activity   Alcohol use: No   Drug use: No   Sexual activity: Not Currently  Other Topics Concern   Not on file  Social History Narrative   Not on file   Social Determinants of Health   Financial Resource Strain: Not on file  Food Insecurity: Not on file  Transportation Needs: Not on file  Physical Activity: Not on file  Stress: Not on file  Social Connections: Not on file     Family History: The patient's family history includes Breast cancer (age of onset: 66) in her sister; Colon cancer (age of onset: 65) in her daughter; Diabetes in her brother, brother, and sister;  Hypertension in her brother, brother, and sister; Prostate cancer (age of onset: 73) in her brother; Uterine cancer (age of onset: 59) in her daughter.  ROS:   Please see the history of present illness.    All other systems reviewed and are negative.  EKGs/Labs/Other Studies Reviewed:    The following studies were reviewed today: I discussed my findings with the patient at length.   Recent Labs: 01/26/2022: ALT 30; BUN 14; Creatinine 0.7; Hemoglobin 13.3; Platelets 267; Potassium 3.9; Sodium 140  Recent Lipid Panel    Component Value Date/Time   CHOL 185 04/20/2021 0943   TRIG 126 04/20/2021 0943   HDL 53 04/20/2021 0943   CHOLHDL 3.5 04/20/2021 0943   LDLCALC 110 (H) 04/20/2021 0943    Physical Exam:    VS:  BP (!) 178/95   Pulse 90  Ht '5\' 4"'$  (1.626 m)   Wt 232 lb 3.2 oz (105.3 kg)   SpO2 93%   BMI 39.86 kg/m     Wt Readings from Last 3 Encounters:  05/31/22 232 lb 3.2 oz (105.3 kg)  01/26/22 230 lb 9.6 oz (104.6 kg)  11/09/21 231 lb 1.6 oz (104.8 kg)     GEN: Patient is in no acute distress HEENT: Normal NECK: No JVD; No carotid bruits LYMPHATICS: No lymphadenopathy CARDIAC: Hear sounds regular, 2/6 systolic murmur at the apex. RESPIRATORY:  Clear to auscultation without rales, wheezing or rhonchi  ABDOMEN: Soft, non-tender, non-distended MUSCULOSKELETAL:  No edema; No deformity  SKIN: Warm and dry NEUROLOGIC:  Alert and oriented x 3 PSYCHIATRIC:  Normal affect   Signed, Amber Lindau, MD  05/31/2022 11:33 AM    [place]

## 2022-06-06 DIAGNOSIS — J449 Chronic obstructive pulmonary disease, unspecified: Secondary | ICD-10-CM | POA: Diagnosis not present

## 2022-06-06 DIAGNOSIS — M810 Age-related osteoporosis without current pathological fracture: Secondary | ICD-10-CM | POA: Diagnosis not present

## 2022-06-06 DIAGNOSIS — Z87891 Personal history of nicotine dependence: Secondary | ICD-10-CM | POA: Diagnosis not present

## 2022-06-06 DIAGNOSIS — I2510 Atherosclerotic heart disease of native coronary artery without angina pectoris: Secondary | ICD-10-CM | POA: Diagnosis not present

## 2022-06-09 DIAGNOSIS — H401131 Primary open-angle glaucoma, bilateral, mild stage: Secondary | ICD-10-CM | POA: Diagnosis not present

## 2022-07-28 ENCOUNTER — Ambulatory Visit: Payer: Medicare HMO | Admitting: Oncology

## 2022-07-28 NOTE — Progress Notes (Deleted)
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  Her prognosis is great and I have discussed the diagnosis with her.  We also discussed the concept of chemoprevention and the risks and benefits.  She is in agreement to try raloxifene and I will prescribe 60 mg daily.  She will call if she has any questions or problems with this medication.  Osteoporosis This was confirmed on a bone density scan of August 31, 2021.  She is on vitamin D weekly and calcium twice daily.  Hopefully the raloxifene will help her bones as well.  Strong family history of breast cancer and prostate cancer She has met with the genetic counselor on March 3 and genetic testing was sent to the [facility].  Results are negative.   We will start her on raloxifene for chemoprevention and I once again reviewed the rationale and potential toxicities.  Since the surgeon will be seeing her for check in 3 months, I will plan to see her back in 6 months for reexamination.  I discussed the assessment and treatment plan with the patient.  The patient was provided an opportunity to ask questions and all were answered.  The patient agreed with the plan and demonstrated an understanding of the instructions.  The patient was advised to call back if the symptoms worsen or if the condition fails to improve as anticipated.  I provided 15 minutes of face-to-face time during this this encounter and > 50% was spent counseling as documented under my assessment and plan.    Derwood Kaplan, MD [facility] AT [facility] [address] [place] [address] Dept: [phone] Dept Fax: [phone]   Derwood Kaplan, MD   12/1/20231:58 PM  CHIEF COMPLAINT:  CC: New ductal carcinoma in situ  Current Treatment: Chemoprevention with raloxifene   HISTORY OF PRESENT ILLNESS:  Amber Sexton is a 66 y.o. female with newly diagnosed ductal carcinoma in situ of the left breast who is referred in consultation from Dr. Orrin Brigham for assessment and management.  This was found on a screening mammogram which was performed on December 2 and revealed possible masses of the left breast.  She came back for diagnostic mammogram on September 14, 2021 and this confirmed isodense masses measuring 9 mm at 12 o'clock and another at 3 o'clock, measuring 8 mm.  An ultrasound was performed that same day and confirmed that the mass at 12 o'clock was mixed cystic and solid and indeterminant, so a biopsy was recommended. This measured approximately 1.1 cm.  The lesion at 3 o'clock was felt to be a benign cyst measuring 0.8 cm.  A biopsy was performed of the suspicious lesion on January 26 and revealed ductal carcinoma in situ.  She had surgery on February 21 with lumpectomy and clear margins were obtained.  The final pathology revealed a ductal carcinoma in situ with micropapillary and cribriform patterns measuring 12 mm and grade 1.  Estrogen and progesterone receptors were both highly positive at 100%.  She has also had a bone density scan on August 31, 2021, which revealed osteoporosis of the femur with a T score of -3.0 and osteopenia of the spine  with a T score measuring -2.2.  She does take calcium twice daily and has been placed on high-dose vitamin D weekly. Her last mammogram had been about 5 years ago and so she was not getting this yearly.  She had menarche at age 3 and had surgical menopause at age 15 with a partial hysterectomy for dysfunctional uterine bleeding.  This was performed after multiple other hormonal maneuvers were unsuccessful.  Her ovaries were left intact.  She was not placed on hormone replacement therapy.   She had her first birth at age 30.  INTERVAL HISTORY:  I have reviewed her chart and materials related to her cancer extensively and collaborated history with the patient. Summary of oncologic history is as follows: Oncology History  Ductal carcinoma in situ (DCIS) of breast  10/18/2021 Cancer Staging   Staging form: Breast, AJCC 8th Edition - Clinical stage from 10/18/2021: Stage 0 (cTis (DCIS), cN0, cM0, G1, ER+, PR+, HER2: Not Assessed) - Signed by Derwood Kaplan, MD on 11/20/2021 Histopathologic type: Intraductal carcinoma, noninfiltrating, NOS Stage prefix: Initial diagnosis Method of lymph node assessment: Clinical Nuclear grade: G1 Multigene prognostic tests performed: None Histologic grading system: 3 grade system Laterality: Left Tumor size (mm): 12 Lymph-vascular invasion (LVI): LVI not present (absent)/not identified Diagnostic confirmation: Positive histology PLUS positive immunophenotyping and/or positive genetic studies Specimen type: Excision Staged by: Managing physician Menopausal status: Postmenopausal Circulating tumor cells (CTC): Not assessed Stage used in treatment planning: Yes National guidelines used in treatment planning: Yes Type of national guideline used in treatment planning: NCCN   10/31/2021 Initial Diagnosis   Ductal carcinoma in situ (DCIS) of breast   11/15/2021 Genetic Testing   Negative hereditary cancer genetic testing: no pathogenic variants detected in [facility] CancerNext-Expanded +RNAinsight Panel.  Report date is 11/15/2021.   The CancerNext-Expanded gene panel offered by [facility] and includes sequencing, rearrangement, and RNA analysis for the following 77 genes: AIP, ALK, APC, ATM, AXIN2, BAP1, BARD1, BLM, BMPR1A, BRCA1, BRCA2, BRIP1, CDC73, CDH1, CDK4, CDKN1B, CDKN2A, CHEK2, CTNNA1, DICER1, FANCC, FH, FLCN, GALNT12, KIF1B, LZTR1, MAX, MEN1, MET, MLH1, MSH2, MSH3, MSH6, MUTYH, NBN, NF1, NF2, NTHL1, PALB2, PHOX2B, PMS2, POT1, PRKAR1A,  PTCH1, PTEN, RAD51C, RAD51D, RB1, RECQL, RET, SDHA, SDHAF2, SDHB, SDHC, SDHD, SMAD4, SMARCA4, SMARCB1, SMARCE1, STK11, SUFU, TMEM127, TP53, TSC1, TSC2, VHL and XRCC2 (sequencing and deletion/duplication); EGFR, EGLN1, HOXB13, KIT, MITF, PDGFRA, POLD1, and POLE (sequencing only); EPCAM and GREM1 (deletion/duplication only).      Hiawatha is seen in the clinic for follow up of her ductal carcinoma in situ.  She completed radiation last month and is here to discuss chemoprevention.  I discussed the schedule and potential toxicities as well as the rationale for hormonal therapy for prevention of breast cancer.  She also has osteoporosis and so we will prescribe raloxifene 60 mg daily and she agrees.  She met with the genetic counselor on March 3 and testing was negative for any clinically significant mutation. CBC and comprehensive metabolic profile from today are unremarkable other than a nonfasting blood sugar of 168.  She is up-to-date on colonoscopy and will be due at 5 years.  She denies fever, chills, night sweats, or other signs of infection. She denies cardiorespiratory and gastrointestinal issues. She  denies pain. Her appetite is good. Her weight has been stable.  Her surgeon will be seeing her in 3 months with mammography. HISTORY:   Past Medical History:  Diagnosis Date   Abnormal nuclear stress test 06/02/2019   Angina pectoris ([place])  06/02/2019   Arthritis    CAD (coronary artery disease) 06/09/2019   Chest pain 07/21/2015   COPD (chronic obstructive pulmonary disease) ([place])    Ductal carcinoma in situ (DCIS) of breast 10/31/2021   Dyspnea on exertion 04/28/2019   Essential hypertension 07/21/2015   Ex-cigarette smoker 07/21/2015   Family history of breast cancer 10/31/2021   Family history of prostate cancer 10/31/2021   Genetic testing 11/15/2021   Negative hereditary cancer genetic testing: no pathogenic variants detected in [facility] +RNAinsight Panel.  Report date is  11/15/2021.   The CancerNext-Expanded gene panel offered by [facility] and includes sequencing, rearrangement, and RNA analysis for the following 77 genes: AIP, ALK, APC, ATM, AXIN2, BAP1, BARD1, BLM, BMPR1A, BRCA1, BRCA2, BRIP1, CDC73, CDH1, CDK4, CDKN1B,    Meniere disease    Mixed dyslipidemia 03/02/2021   Morbid obesity ([place]) 11/03/2019   Obesity (BMI 35.0-39.9 without comorbidity) 07/20/2020   Osteoporosis 08/31/2021   Pulmonary hypertension ([place])    Sleep apnea 04/28/2019    Past Surgical History:  Procedure Laterality Date   ABDOMINAL HYSTERECTOMY     CORONARY STENT INTERVENTION N/A 06/09/2019   Procedure: CORONARY STENT INTERVENTION;  Surgeon: Belva Crome, MD;  Location: [facility];  Service: Cardiovascular;  Laterality: N/A;   LEFT HEART CATH AND CORONARY ANGIOGRAPHY N/A 06/09/2019   Procedure: LEFT HEART CATH AND CORONARY ANGIOGRAPHY;  Surgeon: Belva Crome, MD;  Location: [place] [facility];  Service: Cardiovascular;  Laterality: N/A;   TONSILLECTOMY      Family History  Problem Relation Age of Onset   Diabetes Sister    Breast cancer Sister 53   Hypertension Sister    Diabetes Brother    Hypertension Brother    Diabetes Brother    Prostate cancer Brother 2   Hypertension Brother    Colon cancer Daughter 40   Uterine cancer Daughter 41    Social History:  reports that she quit smoking about 9 years ago. Her smoking use included cigarettes. She smoked an average of 1 pack per day. She has never used smokeless tobacco. She reports that she does not drink alcohol and does not use drugs.The patient is accompanied by her daughter Mardene Celeste today.  She does have 2 children.  She is divorced and disabled.  She previously worked at [employer].  Allergies:  Allergies  Allergen Reactions   Ampicillin Anaphylaxis    Did it involve swelling of the face/tongue/throat, SOB, or low BP? Yes Did it involve sudden or severe rash/hives, skin peeling, or any reaction on the inside  of your mouth or nose? No Did you need to seek medical attention at a hospital or doctor's office? Yes When did it last happen?     several years ago  If all above answers are "NO", may proceed with cephalosporin use.    Rosuvastatin Diarrhea and Other (See Comments)    Abdominal cramping   Pitavastatin Diarrhea    Pt also had abd pain.    Current Medications: Current Outpatient Medications  Medication Sig Dispense Refill   alendronate (FOSAMAX) 70 MG tablet Take 70 mg by mouth once a week.     ANORO ELLIPTA 62.5-25 MCG/INH AEPB Inhale 1 puff into the lungs daily.     aspirin EC 81 MG tablet Take 81 mg by mouth daily.     atorvastatin (LIPITOR) 10 MG tablet TAKE 1 TABLET BY MOUTH DAILY 30 tablet 12   Calcium Carb-Cholecalciferol (CALCIUM 600 + D PO) Take  1 tablet by mouth daily.     lansoprazole (PREVACID) 30 MG capsule Take 30 mg by mouth daily.     LUMIGAN 0.01 % SOLN Place 1 drop into both eyes daily.     nitroGLYCERIN (NITROSTAT) 0.4 MG SL tablet Place 1 tablet (0.4 mg total) under the tongue every 5 (five) minutes as needed for chest pain. 25 tablet 2   oxyCODONE (OXY IR/ROXICODONE) 5 MG immediate release tablet Take 5 mg by mouth as needed for pain.     raloxifene (EVISTA) 60 MG tablet Take 1 tablet (60 mg total) by mouth daily. (Patient not taking: Reported on 05/31/2022) 30 tablet 5   traMADol (ULTRAM) 50 MG tablet Take 50 mg by mouth every 4 (four) hours as needed for pain.     No current facility-administered medications for this visit.    REVIEW OF SYSTEMS:  Review of Systems  Constitutional: Negative.   HENT:  Negative.    Eyes: Negative.   Respiratory:         Dyspnea with exertion  Cardiovascular: Negative.   Gastrointestinal: Negative.   Endocrine: Negative.   Genitourinary: Negative.    Musculoskeletal: Negative.   Skin: Negative.   Neurological: Negative.   Hematological: Negative.   Psychiatric/Behavioral: Negative.        VITALS:  There were no  vitals taken for this visit.  Wt Readings from Last 3 Encounters:  05/31/22 232 lb 3.2 oz (105.3 kg)  01/26/22 230 lb 9.6 oz (104.6 kg)  11/09/21 231 lb 1.6 oz (104.8 kg)    There is no height or weight on file to calculate BMI.  Performance status (ECOG): 0 - Asymptomatic  PHYSICAL EXAM:  Physical Exam Constitutional:      Appearance: Normal appearance. She is obese.  HENT:     Head: Normocephalic and atraumatic.     Nose: Nose normal.  Eyes:     Extraocular Movements: Extraocular movements intact.     Conjunctiva/sclera: Conjunctivae normal.     Pupils: Pupils are equal, round, and reactive to light.  Cardiovascular:     Rate and Rhythm: Normal rate and regular rhythm.     Heart sounds: Normal heart sounds.  Pulmonary:     Effort: Pulmonary effort is normal.     Breath sounds: Normal breath sounds.  Chest:  Breasts:    Right: Normal.     Left: Normal.     Comments: She has a small well-healed incision in the upper outer quadrant of the left breast. Abdominal:     General: Bowel sounds are normal.     Palpations: Abdomen is soft.  Musculoskeletal:        General: Normal range of motion.     Cervical back: Normal range of motion and neck supple.  Skin:    General: Skin is warm and dry.  Neurological:     General: No focal deficit present.     Mental Status: She is alert and oriented to person, place, and time.  Psychiatric:        Mood and Affect: Mood normal.        Behavior: Behavior normal.        Thought Content: Thought content normal.        Judgment: Judgment normal.     LABS:      Latest Ref Rng & Units 01/26/2022   12:00 AM 11/09/2021   12:00 AM 03/07/2021   10:54 AM  CBC  WBC  7.0     6.8  6.3   Hemoglobin 12.0 - 16.0 13.3     13.7     13.8   Hematocrit 36 - 46 42     43     42.8   Platelets 150 - 400 K/uL 267     275     243      This result is from an external source.       Latest Ref Rng & Units 01/26/2022   12:00 AM 11/09/2021   12:00 AM  04/20/2021    9:43 AM  CMP  BUN 4 - _0 Creatinine 0.5 - 1.1 0.7     0.6       Sodium 137 - 147 140     139       Potassium 3.5 - 5.1 mEq/L 3.9     3.8       Chloride 99 - 108 101     105       CO2 13 - _1 Calcium 8.7 - 10.7 9.6     9.3       Total Protein 6.0 - 8.5 g/dL   6.6   Total Bilirubin 0.0 - 1.2 mg/dL   0.3   Alkaline Phos 25 - 125 94     102     133   AST 13 - 35 _2 ALT 7 - 35 U/L _3 This result is from an external source.      STUDIES:     EXAM: 07/29/21 DIGITAL SCREENING BILATERAL MAMMOGRAM WITH TOMOSYNTHESIS AND CAD  TECHNIQUE:  Bilateral screening digital craniocaudal and mediolateral oblique  mammograms were obtained. Bilateral screening digital breast  tomosynthesis was performed. The images were evaluated with  computer-aided detection.  COMPARISON: Previous exam(s).  ACR Breast Density Category c: The breast tissue is heterogeneously  dense, which may obscure small masses.  FINDINGS:  In the left breast possible masses warrant further evaluation. In  the right breast, no findings suspicious for malignancy.  IMPRESSION:  Further evaluation is suggested for a possible masses in the left  breast.  RECOMMENDATION:  Diagnostic mammogram and possibly ultrasound of the left breast.  (Code:FI-L-64M)  The patient will be contacted regarding the findings, and additional  imaging will be scheduled.  BI-RADS CATEGORY 0: Incomplete. Need additional imaging evaluation  and/or prior mammograms for comparison.  Electronically Signed  By: Lovey Newcomer M.D.  On: 08/01/2021 12:22     EXAM: 09/14/21 DIGITAL DIAGNOSTIC UNILATERAL LEFT MAMMOGRAM WITH TOMOSYNTHESIS AND  CAD; ULTRASOUND LEFT BREAST  TECHNIQUE:  Left digital diagnostic mammography and breast tomosynthesis was  performed. The images were evaluated with computer-aided detection.;  Targeted ultrasound examination of the left breast was  performed.  COMPARISON: Previous exam(s).  ACR Breast Density Category c: The breast tissue is heterogeneously  dense, which may obscure small masses.  FINDINGS:  Spot-compression CC and MLO views of the areas of concern and a full  field mediolateral view were obtained.  Spot compression images confirm what are either adjacent isodense  masses or clustered isodense masses in the upper retroareolar  location at anterior depth, measuring in total approximately 9 mm.  There is no associated architectural distortion or suspicious  calcifications.  The mass  questioned in the retroareolar location at posterior depth,  visible only on the screening MLO view, is shown on the spot  compression view to represent a circumscribed isodense mass  measuring approximately 8 mm. There is no associated architectural  distortion or suspicious calcifications. On the full field  mediolateral view, the mass is located more inferiorly relative to  its location on the MLO view, indicating its location in the outer  breast, therefore at or near 3 o'clock.   Targeted ultrasound is performed, demonstrating a mixed cystic and  solid mass at the 12 o'clock position 1 cm from the nipple measuring  approximately 1.1 x 0.6 x 0.6 cm, demonstrating no posterior  characteristics and no internal power Doppler flow, accounting for  the screening mammographic finding in the upper retroareolar  location.  At the 3 o'clock position 2 cm from the nipple at posterior depth is  an oval circumscribed parallel anechoic mass measuring approximately  0.8 x 0.4 x 0.7 cm, demonstrating posterior acoustic enhancement and  no internal power Doppler flow, accounting for the screening  mammographic finding.  IMPRESSION:  1. Indeterminate 1.1 cm complicated mixed cystic and solid mass in  the upper retroareolar location at 12 o'clock 1 cm from the nipple.  2. Benign simple cyst in the outer breast at posterior depth at the  3  o'clock position 2 cm from the nipple.   RECOMMENDATION:  Ultrasound-guided core needle biopsy of the indeterminate mass at 12  o'clock 1 cm from the nipple.  I have discussed the findings and recommendations with the patient.  The ultrasound core needle biopsy procedure was discussed with the  patient and her questions were answered. She wishes to proceed with  the biopsy which will be scheduled by the Breast Imaging staff.  BI-RADS CATEGORY 4: Suspicious.  Electronically Signed  By: Evangeline Dakin M.D.  On: 09/14/2021 11:33     Exam(s): [id number] DEXA/DG DEXA  EXAM: 08/31/21 DUAL X-RAY ABSORPTIOMETRY (DXA) FOR BONE MINERAL DENSITY  IMPRESSION:  Amulya Mosqueda completed a BMD test on 08/31/2021 using the [employer]ierks (analysis version: 13.60) manufactured by [employer]. The following summarizes the results of our evaluation.  Technologist: [facility]  PATIENT BIOGRAPHICAL:  Name: NICHOLE, NEYER  Patient ID: [id number] [facility] Birth Date: 10-Jan-1956 Height: 64.0 in.  Gender: Female Exam Date: 08/31/2021 Weight: 230.0 lbs.  Indications: Fractures: Treatments:  ASSESSMENT:  The BMD measured at Femur Neck Left is 0.624 g/cm2 with a T-score of  -3.0. This patient is considered osteoporotic according to [facility]) criteria.  The scan quality is good. Exclusions: L1 due to degenerative  changes.  Site Region Measured Measured WHO Young Adult BMD  Date Age Classification T-score  AP Spine L2-L4 08/31/2021 64.1 Osteopenia -2.2 0.941 g/cm2  DualFemur Neck Left 08/31/2021 64.1 Osteoporosis -3.0 0.624 g/cm2  World Health [facility]) criteria for post-menopausal,  Caucasian Women:  Normal: T-score at or above -1 SD  Osteopenia: T-score between -1 and -2.5 SD  Osteoporosis: T-score at or below -2.5 SD  RECOMMENDATIONS:  1. All patients should optimize calcium and vitamin D intake.  2. Consider FDA- approved medical therapies in post menopausal women  and  men aged 75 years and older, based on the following:  a. A hip or vertebral (clinical or morphometric) fracture.  b. T- score < 2.5 of the femoral neck or spine after appropriate  evaluation to exclude secondary causes.  c. Low bone mass (T- score between -1.0 and -  2.5 at femoral neck or  spine) and a 10 -year probability of a hip  fracture > 3% or a 10 -year probability of a major osteoporosis-  related fracture > 20% based on the [place]- adapted  WHO algorithm.  d. Clinician judgement and/ or patient preferences may indicate  treatment for people with 10- year fracture probabilities above or  below these levels.   FOLLOW-UP:  People with diagnosed cases of osteoporosis or at high risk for  fracture should have regular bone mineral density tests. For  patients eligible for Medicare, routine testing is allowed once  every 2 years. The testing frequency can be increased to one year  for patients who have rapidly progressing disease, those who are  receiving medical therapy to restore bone mass, or have additional  risk factors.  I have reviewed this report and agree with the above findings.  Electronically Signed  By: Elmer Picker M.D.  On: 08/31/2021 16:04  Electronically   PATHOLOGY  09/22/21  Diagnosis  Breast, left, needle core biopsy, left breast biopsy 12:00 1 cm from nipple with ribbon clip Ductal carcinoma in situ with calcifications  Microscopic comment By immunohistochemistry, the ductal epithelium is negative for cytokeratin 5/6 and shows strong positive ER expression. Cytokeratin AE1/AE3 does not highlight an infiltrative epithelial component. Based on the biopsy, the ductal carcinoma in situ has a papillary pattern, low nuclear grade and measures 0.2 cm in greatest linear extent.  Estrogen receptor: 100%, positive, strong staining intensity Progesterone receptor: 100%, positive, strong staining intensity  Thressa Sheller, MD    PATHOLOGY 10/18/21  Breast,  lumpectomy, left mass Ductal carcinoma in situ grade 1, adjacent to ribbon clip biopsy site. DCIS is 1 mm from closest (medial) margin. All margins negative for DCIS. No invasive carcinoma identified.  Breast, excision, additional anterior medial margin Fibrocystic changes. No atypia, in situ or invasive malignancy identified. All margins negative for DCIS.   Microscopic comment:  DCIS of the breast resection Szymon laterality: Left Histologic type: Ductal carcinoma in situ, micropapillary and cribriform patterns Size of DCIS: 12 mm Nuclear grade: Grade 1 (low) Necrosis: Not identified Margins: All margins negative for DCIS Specify closest margin: Medial 1 mm Regional lymph nodes: No regional lymph nodes submitted or found Breast biomarker testing performed on previous biopsy: Yes Testing performed on Case number: [id number] Estrogen receptor: 100% positive, strong staining intensity Progesterone receptor: 100%, positive, strong staining intensity Pathologic stage classification: pTis, pN not assigned  comment: CK5/6, ER and E-cadherin immunostains are utilized to evaluate the lesion.  Haywood Lasso MD

## 2022-08-07 DIAGNOSIS — Z87891 Personal history of nicotine dependence: Secondary | ICD-10-CM | POA: Diagnosis not present

## 2022-08-07 DIAGNOSIS — Z122 Encounter for screening for malignant neoplasm of respiratory organs: Secondary | ICD-10-CM | POA: Diagnosis not present

## 2022-10-11 ENCOUNTER — Ambulatory Visit: Payer: Medicare PPO | Attending: Cardiology | Admitting: Cardiology

## 2022-10-11 ENCOUNTER — Encounter: Payer: Self-pay | Admitting: Cardiology

## 2022-10-11 VITALS — BP 160/80 | HR 89 | Ht 64.0 in | Wt 235.0 lb

## 2022-10-11 DIAGNOSIS — I10 Essential (primary) hypertension: Secondary | ICD-10-CM

## 2022-10-11 DIAGNOSIS — G4730 Sleep apnea, unspecified: Secondary | ICD-10-CM | POA: Diagnosis not present

## 2022-10-11 DIAGNOSIS — E782 Mixed hyperlipidemia: Secondary | ICD-10-CM

## 2022-10-11 DIAGNOSIS — I2510 Atherosclerotic heart disease of native coronary artery without angina pectoris: Secondary | ICD-10-CM | POA: Diagnosis not present

## 2022-10-11 NOTE — Discharge Summary (Signed)
This visit was accompanied by Jerl Santos.

## 2022-10-11 NOTE — Progress Notes (Signed)
Cardiology Office Note:    Date:  10/11/2022   ID:  Amber Sexton, DOB March 13, 1956, MRN [id number]  PCP:  Care, [place] Better  Cardiologist:  Jenean Lindau, MD   Referring MD: Care, [place]:    1. Coronary artery disease involving native coronary artery of native heart without angina pectoris   2. Essential hypertension   3. Sleep apnea, unspecified type   4. Mixed dyslipidemia   5. Morbid obesity ([place])    PLAN:    In order of problems listed above:  Angina pectoris: Coronary artery disease: Following recommendations were made to the patient.  Her symptoms appear to be significant.  Her family member was in the room throughout the visit.In view of the patient's symptoms, I discussed with the patient options for evaluation. Invasive and noninvasive options were given to the patient. I discussed stress testing and coronary angiography and left heart catheterization at length. Benefits, pros and cons of each approach were discussed at length. Patient had multiple questions which were answered to the patient's satisfaction. Patient opted for invasive evaluation and we will set up for coronary angiography and left heart catheterization. Further recommendations will be made based on the findings with coronary angiography. In the interim if the patient has any significant symptoms in hospital to the nearest emergency room.  Sublingual nitroglycerin prescription was sent, its protocol and 911 protocol explained and the patient vocalized understanding questions were answered to the patient's satisfaction Essential hypertension: Blood pressure stable and diet was emphasized.  Her blood pressure stable at home.  She appears anxious today.  She will keep a track of her blood pressures at home. Mixed dyslipidemia: On lipid-lowering medications.  Lipids were reviewed. Obesity: Weight reduction stressed and diet was emphasized and she promises to do better.  She  is still vaping and I advised her to quit so. Patient will be seen in follow-up appointment in 6 months or earlier if the patient has any concerns    Medication Adjustments/Labs and Tests Ordered: Current medicines are reviewed at length with the patient today.  Concerns regarding medicines are outlined above.  No orders of the defined types were placed in this encounter.  No orders of the defined types were placed in this encounter.    No chief complaint on file.    History of Present Illness:    Amber Sexton is a 67 y.o. female.  Patient has past medical history of coronary artery disease post stenting, essential hypertension, dyslipidemia and obesity.  Overall she leads a sedentary lifestyle.  She mentions to me that she has chest tightness on minimal exertion.  She is concerned about this.  She mentions to me that the symptoms are very similar to what she had before her coronary stent in the past.  No radiation to the neck or to the arms.  At the time of my evaluation, the patient is alert awake oriented and in no distress.  Entire visit was chaperoned by Amber Sexton, [place].  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  Report date is 11/15/2021.   The CancerNext-Expanded gene panel offered by [facility] and includes sequencing, rearrangement, and RNA analysis for the following 77  genes: AIP, ALK, APC, ATM, AXIN2, BAP1, BARD1, BLM, BMPR1A, BRCA1, BRCA2, BRIP1, CDC73, CDH1, CDK4, CDKN1B,    Meniere disease    Mixed dyslipidemia 03/02/2021   Morbid obesity ([place]) 11/03/2019   Obesity (BMI 35.0-39.9 without comorbidity) 07/20/2020   Osteoporosis 08/31/2021   Pulmonary hypertension ([place])    Sleep apnea 04/28/2019    Past Surgical History:  Procedure Laterality Date   ABDOMINAL HYSTERECTOMY     CORONARY STENT INTERVENTION N/A 06/09/2019   Procedure: CORONARY STENT INTERVENTION;  Surgeon: Belva Crome, MD;  Location: [facility];  Service: Cardiovascular;  Laterality: N/A;   LEFT HEART CATH AND CORONARY ANGIOGRAPHY N/A 06/09/2019   Procedure: LEFT HEART CATH AND CORONARY ANGIOGRAPHY;  Surgeon: Belva Crome, MD;  Location: [facility];  Service: Cardiovascular;  Laterality: N/A;   TONSILLECTOMY      Current Medications: Current Meds  Medication Sig   alendronate (FOSAMAX) 70 MG tablet Take 70 mg by mouth once a week.   ANORO ELLIPTA 62.5-25 MCG/INH AEPB Inhale 1 puff into the lungs daily.   aspirin EC 81 MG tablet Take 81 mg by mouth daily.   atorvastatin (LIPITOR) 10 MG tablet TAKE 1 TABLET BY MOUTH DAILY   Calcium Carb-Cholecalciferol (CALCIUM 600 + D PO) Take 1 tablet by mouth daily.   lansoprazole (PREVACID) 30 MG capsule Take 30 mg by mouth daily.   LUMIGAN 0.01 % SOLN Place 1 drop into both eyes daily.   nitroGLYCERIN (NITROSTAT) 0.4 MG SL tablet Place 1 tablet (0.4 mg total) under the tongue every 5 (five) minutes as needed for chest pain.     Allergies:   Ampicillin, Rosuvastatin, and Pitavastatin   Social History   Socioeconomic History   Marital status: Divorced    Spouse name: Not on file   Number of children: Not on file   Years of education: Not on file   Highest education level: Not on file  Occupational History   Not on file  Tobacco Use   Smoking status: Former    Packs/day: 1.00    Types: Cigarettes    Quit date: 2014     Years since quitting: 10.1   Smokeless tobacco: Never  Vaping Use   Vaping Use: Every day  Substance and Sexual Activity   Alcohol use: No   Drug use: No   Sexual activity: Not Currently  Other Topics Concern   Not on file  Social History Narrative   Not on file   Social Determinants of Health   Financial Resource Strain: Not on file  Food Insecurity: Not on file  Transportation Needs: Not on file  Physical Activity: Not on file  Stress: Not on file  Social Connections: Not on file     Family History: The patient's family history includes Breast cancer (age of onset: 21) in her sister; Colon cancer (age of onset: 19) in her daughter; Diabetes in her brother, brother, and sister; Hypertension in her brother, brother, and sister; Prostate cancer (age of onset: 1) in her brother; Uterine cancer (age of onset: 25) in her daughter.  ROS:   Please see the history of present illness.    All  other systems reviewed and are negative.  EKGs/Labs/Other Studies Reviewed:    The following studies were reviewed today: EKG reveals sinus rhythm and nonspecific ST-T changes   Recent Labs: 01/26/2022: ALT 30; BUN 14; Creatinine 0.7; Hemoglobin 13.3; Platelets 267; Potassium 3.9; Sodium 140  Recent Lipid Panel    Component Value Date/Time   CHOL 185 04/20/2021 0943   TRIG 126 04/20/2021 0943   HDL 53 04/20/2021 0943   CHOLHDL 3.5 04/20/2021 0943   LDLCALC 110 (H) 04/20/2021 0943    Physical Exam:    VS:  BP (!) 160/80   Pulse 89   Ht 5' 4"$  (1.626 m)   Wt 235 lb (106.6 kg)   SpO2 95%   BMI 40.34 kg/m     Wt Readings from Last 3 Encounters:  10/11/22 235 lb (106.6 kg)  05/31/22 232 lb 3.2 oz (105.3 kg)  01/26/22 230 lb 9.6 oz (104.6 kg)     GEN: Patient is in no acute distress HEENT: Normal NECK: No JVD; No carotid bruits LYMPHATICS: No lymphadenopathy CARDIAC: Hear sounds regular, 2/6 systolic murmur at the apex. RESPIRATORY:  Clear to auscultation without rales,  wheezing or rhonchi  ABDOMEN: Soft, non-tender, non-distended MUSCULOSKELETAL:  No edema; No deformity  SKIN: Warm and dry NEUROLOGIC:  Alert and oriented x 3 PSYCHIATRIC:  Normal affect   Signed, Jenean Lindau, MD  10/11/2022 10:57 AM    [place]

## 2022-10-11 NOTE — Patient Instructions (Signed)
Medication Instructions:  Your physician has recommended you make the following change in your medication:   Take 81 mg coated aspirin daily.  Use nitroglycerin 1 tablet placed under the tongue at the first sign of chest pain or an angina attack. 1 tablet may be used every 5 minutes as needed, for up to 15 minutes. Do not take more than 3 tablets in 15 minutes. If pain persist call 911 or go to the nearest ED.   *If you need a refill on your cardiac medications before your next appointment, please call your pharmacy*   Lab Work: Your physician recommends that you have a BMET and CBC today in the office for your upcoming procedure.  If you have labs (blood work) drawn today and your tests are completely normal, you will receive your results only by: [place] (if you have MyChart) OR A paper copy in the mail If you have any lab test that is abnormal or we need to change your treatment, we will call you to review the results.   Testing/Procedures:       Cardiac/Peripheral Catheterization   You are scheduled for a Cardiac Catheterization on Monday, February 19 with Dr. Lenna Sciara.  1. Please arrive at the Main Entrance A at [facility]: [place], [place] [address] on February 19 at 7:00 AM (This time is two hours before your procedure to ensure your preparation). Free valet parking service is available. You will check in at ADMITTING. The support person will be asked to wait in the waiting room.  It is OK to have someone drop you off and come back when you are ready to be discharged.        Special note: Every effort is made to have your procedure done on time. Please understand that emergencies sometimes delay scheduled procedures.   . 2. Diet: Do not eat solid foods after midnight.  You may have clear liquids until 5 AM the day of the procedure.  3. Labs: You had your labs done today.  4. Medication instructions in preparation for your  procedure:   Contrast Allergy: No  On the morning of your procedure, take Aspirin 81 mg and any morning medicines NOT listed above.  You may use sips of water.  5. Plan to go home the same day, you will only stay overnight if medically necessary. 6. You MUST have a responsible adult to drive you home. 7. An adult MUST be with you the first 24 hours after you arrive home. 8. Bring a current list of your medications, and the last time and date medication taken. 9. Bring ID and current insurance cards. 10.Please wear clothes that are easy to get on and off and wear slip-on shoes.  Thank you for allowing [place] to care for you!   -- [facility]    Follow-Up: At [facility], you and your health needs are our priority.  As part of our continuing mission to provide you with exceptional heart care, we have created designated Provider Care Teams.  These Care Teams include your primary Cardiologist (physician) and Advanced Practice Providers (APPs -  Physician Assistants and Nurse Practitioners) who all work together to provide you with the care you need, when you need it.  We recommend signing up for the patient portal called "MyChart".  Sign up information is provided on this After Visit Summary.  MyChart is used to connect with patients for Virtual Visits (Telemedicine).  Patients  are able to view lab/test results, encounter notes, upcoming appointments, etc.  Non-urgent messages can be sent to your provider as well.   To learn more about what you can do with MyChart, go to [email or web address].    Your next appointment:   1 month(s)  The format for your next appointment:   In Person  Provider:   Jyl Heinz, MD   Other Instructions  Coronary Angiogram With Stent Coronary angiogram with stent placement is a procedure to widen or open a narrow blood vessel of the heart (coronary artery). Arteries may become blocked by cholesterol buildup (plaques) in  the lining of the artery wall. When a coronary artery becomes partially blocked, blood flow to that area decreases. This may lead to chest pain or a heart attack (myocardial infarction). A stent is a small piece of metal that looks like mesh or spring. Stent placement may be done as treatment after a heart attack, or to prevent a heart attack if a blocked artery is found by a coronary angiogram. Let your health care provider know about: Any allergies you have, including allergies to medicines or contrast dye. All medicines you are taking, including vitamins, herbs, eye drops, creams, and over-the-counter medicines. Any problems you or family members have had with anesthetic medicines. Any blood disorders you have. Any surgeries you have had. Any medical conditions you have, including kidney problems or kidney failure. Whether you are pregnant or may be pregnant. Whether you are breastfeeding. What are the risks? Generally, this is a safe procedure. However, serious problems may occur, including: Damage to nearby structures or organs, such as the heart, blood vessels, or kidneys. A return of blockage. Bleeding, infection, or bruising at the insertion site. A collection of blood under the skin (hematoma) at the insertion site. A blood clot in another part of the body. Allergic reaction to medicines or dyes. Bleeding into the abdomen (retroperitoneal bleeding). Stroke (rare). Heart attack (rare). What happens before the procedure? Staying hydrated Follow instructions from your health care provider about hydration, which may include: Up to 2 hours before the procedure - you may continue to drink clear liquids, such as water, clear fruit juice, black coffee, and plain tea.    Eating and drinking restrictions Follow instructions from your health care provider about eating and drinking, which may include: 8 hours before the procedure - stop eating heavy meals or foods, such as meat, fried  foods, or fatty foods. 6 hours before the procedure - stop eating light meals or foods, such as toast or cereal. 2 hours before the procedure - stop drinking clear liquids. Medicines Ask your health care provider about: Changing or stopping your regular medicines. This is especially important if you are taking diabetes medicines or blood thinners. Taking medicines such as aspirin and ibuprofen. These medicines can thin your blood. Do not take these medicines unless your health care provider tells you to take them. Generally, aspirin is recommended before a thin tube, called a catheter, is passed through a blood vessel and inserted into the heart (cardiac catheterization). Taking over-the-counter medicines, vitamins, herbs, and supplements. General instructions Do not use any products that contain nicotine or tobacco for at least 4 weeks before the procedure. These products include cigarettes, e-cigarettes, and chewing tobacco. If you need help quitting, ask your health care provider. Plan to have someone take you home from the hospital or clinic. If you will be going home right after the procedure, plan to have someone with you for 24  hours. You may have tests and imaging procedures. Ask your health care provider: How your insertion site will be marked. Ask which artery will be used for the procedure. What steps will be taken to help prevent infection. These may include: Removing hair at the insertion site. Washing skin with a germ-killing soap. Taking antibiotic medicine. What happens during the procedure? An IV will be inserted into one of your veins. Electrodes may be placed on your chest to monitor your heart rate during the procedure. You will be given one or more of the following: A medicine to help you relax (sedative). A medicine to numb the area (local anesthetic) for catheter insertion. A small incision will be made for catheter insertion. The catheter will be inserted into an  artery using a guide wire. The location may be in your groin, your wrist, or the fold of your arm (near your elbow). An X-ray procedure (fluoroscopy) will be used to help guide the catheter to the opening of the heart arteries. A dye will be injected into the catheter. X-rays will be taken. The dye helps to show where any narrowing or blockages are located in the arteries. Tell your health care provider if you have chest pain or trouble breathing. A tiny wire will be guided to the blocked spot, and a balloon will be inflated to make the artery wider. The stent will be expanded to crush the plaques into the wall of the vessel. The stent will hold the area open and improve the blood flow. Most stents have a drug coating to reduce the risk of the stent narrowing over time. The artery may be made wider using a drill, laser, or other tools that remove plaques. The catheter will be removed when the blood flow improves. The stent will stay where it was placed, and the lining of the artery will grow over it. A bandage (dressing) will be placed on the insertion site. Pressure will be applied to stop bleeding. The IV will be removed. This procedure may vary among health care providers and hospitals.    What happens after the procedure? Your blood pressure, heart rate, breathing rate, and blood oxygen level will be monitored until you leave the hospital or clinic. If the procedure is done through the leg, you will lie flat in bed for a few hours or for as long as told by your health care provider. You will be instructed not to bend or cross your legs. The insertion site and the pulse in your foot or wrist will be checked often. You may have more blood tests, X-rays, and a test that records the electrical activity of your heart (electrocardiogram, or ECG). Do not drive for 24 hours if you were given a sedative during your procedure. Summary Coronary angiogram with stent placement is a procedure to widen or  open a narrowed coronary artery. This is done to treat heart problems. Before the procedure, let your health care provider know about all the medical conditions and surgeries you have or have had. This is a safe procedure. However, some problems may occur, including damage to nearby structures or organs, bleeding, blood clots, or allergies. Follow your health care provider's instructions about eating, drinking, medicines, and other lifestyle changes, such as quitting tobacco use before the procedure. This information is not intended to replace advice given to you by your health care provider. Make sure you discuss any questions you have with your health care provider. Document Revised: 03/05/2019 Document Reviewed: 03/05/2019 Elsevier Patient  Education  2021 [place].  Aspirin and Your Heart Aspirin is a medicine that prevents the platelets in your blood from sticking together. Platelets are the cells that your blood uses for clotting. Aspirin can be used to help reduce the risk of blood clots, heart attacks, and other heart-related problems. What are the risks? Daily use of aspirin can cause side effects. Some of these include: Bleeding. Bleeding can be minor or serious. An example of minor bleeding is bleeding from a cut, and the bleeding does not stop. An example of more serious bleeding is stomach bleeding or, rarely, bleeding into the brain. Your risk of bleeding increases if you are also taking NSAIDs, such as ibuprofen. Increased bruising. Upset stomach. An allergic reaction. People who have growths inside the nose (nasal polyps) have an increased risk of developing an aspirin allergy. How to use aspirin to care for your heart Take aspirin only as told by your health care provider. Make sure that you understand how much to take and what form to take. The two forms of aspirin are: Non-enteric-coated.This type of aspirin does not have a coating and is absorbed quickly. This type of  aspirin also comes in a chewable form. Enteric-coated. This type of aspirin has a coating that releases the medicine very slowly. Enteric-coated aspirin might cause less stomach upset than non-enteric-coated aspirin. This type of aspirin should not be chewed or crushed. Work with your health care provider to find out whether it is safe and beneficial for you to take aspirin daily. Taking aspirin daily may be helpful if: You have had a heart attack or chest pain, or you are at risk for a heart attack. You have a condition in which certain heart vessels are blocked (coronary artery disease), and you have had a procedure to treat it. Examples are: Open-heart surgery, such as coronary artery bypass surgery (CABG). Coronary angioplasty,which is done to widen a blood vessel of your heart. Having a small mesh tube, or stent, placed in your coronary artery. You have had certain types of stroke or a mini-stroke known as a transient ischemic attack (TIA). You have a narrowing of the arteries that supply the limbs (peripheral artery disease, or PAD). You have long-term (chronic) heart rhythm problems, such as atrial fibrillation, and your health care provider thinks aspirin may help. You have valve disease or have had surgery on a valve. You are considered at increased risk of developing coronary artery disease or PAD.    Follow these instructions at home Medicines Take over-the-counter and prescription medicines only as told by your health care provider. If you are taking blood thinners: Talk with your health care provider before you take any medicines that contain aspirin or NSAIDs, such as ibuprofen. These medicines increase your risk for dangerous bleeding. Take your medicine exactly as told, at the same time every day. Avoid activities that could cause injury or bruising, and follow instructions about how to prevent falls. Wear a medical alert bracelet or carry a card that lists what medicines you  take. General instructions Do not drink alcohol if: Your health care provider tells you not to drink. You are pregnant, may be pregnant, or are planning to become pregnant. If you drink alcohol: Limit how much you use to: 0-1 drink a day for women. 0-2 drinks a day for men. Be aware of how much alcohol is in your drink. In the [place] one drink equals one 12 oz bottle of beer (355 mL), one 5 oz glass  of wine (148 mL), or one 1 oz glass of hard liquor (44 mL). Keep all follow-up visits as told by your health care provider. This is important. Where to find more information The American Heart Association: www.heart.org Contact a health care provider if you have: Unusual bleeding or bruising. Stomach pain or nausea. Ringing in your ears. An allergic reaction that causes hives, itchy skin, or swelling of the lips, tongue, or face. Get help right away if: You notice that your bowel movements are bloody, or dark red or black in color. You vomit or cough up blood. You have blood in your urine. You cough, breathe loudly (wheeze), or feel short of breath. You have chest pain, especially if the pain spreads to your arms, back, neck, or jaw. You have a headache with confusion. You have any symptoms of a stroke. "BE FAST" is an easy way to remember the main warning signs of a stroke: B - Balance. Signs are dizziness, sudden trouble walking, or loss of balance. E - Eyes. Signs are trouble seeing or a sudden change in vision. F - Face. Signs are sudden weakness or numbness of the face, or the face or eyelid drooping on one side. A - Arms. Signs are weakness or numbness in an arm. This happens suddenly and usually on one side of the body. S - Speech. Signs are sudden trouble speaking, slurred speech, or trouble understanding what people say. T - Time. Time to call emergency services. Write down what time symptoms started. You have other signs of a stroke, such as: A sudden, severe headache with no  known cause. Nausea or vomiting. Seizure. These symptoms may represent a serious problem that is an emergency. Do not wait to see if the symptoms will go away. Get medical help right away. Call your local emergency services (911 in the [place] Do not drive yourself to the hospital. Summary Aspirin use can help reduce the risk of blood clots, heart attacks, and other heart-related problems. Daily use of aspirin can cause side effects. Take aspirin only as told by your health care provider. Make sure that you understand how much to take and what form to take. Your health care provider will help you determine whether it is safe and beneficial for you to take aspirin daily. This information is not intended to replace advice given to you by your health care provider. Make sure you discuss any questions you have with your health care provider. Document Revised: 05/19/2019 Document Reviewed: 05/19/2019 Elsevier Patient Education  2021 [place]. Nitroglycerin sublingual tablets What is this medicine? NITROGLYCERIN (nye troe GLI ser in) is a type of vasodilator. It relaxes blood vessels, increasing the blood and oxygen supply to your heart. This medicine is used to relieve chest pain caused by angina. It is also used to prevent chest pain before activities like climbing stairs, going outdoors in cold weather, or sexual activity. This medicine may be used for other purposes; ask your health care provider or pharmacist if you have questions. COMMON BRAND NAME(S): Nitroquick, Nitrostat, Nitrotab What should I tell my health care provider before I take this medicine? They need to know if you have any of these conditions: anemia head injury, recent stroke, or bleeding in the brain liver disease previous heart attack an unusual or allergic reaction to nitroglycerin, other medicines, foods, dyes, or preservatives pregnant or trying to get pregnant breast-feeding How should I use this medicine? Take  this medicine by mouth as needed. Use at the first  sign of an angina attack (chest pain or tightness). You can also take this medicine 5 to 10 minutes before an event likely to produce chest pain. Follow the directions exactly as written on the prescription label. Place one tablet under your tongue and let it dissolve. Do not swallow whole. Replace the dose if you accidentally swallow it. It will help if your mouth is not dry. Saliva around the tablet will help it to dissolve more quickly. Do not eat or drink, smoke or chew tobacco while a tablet is dissolving. Sit down when taking this medicine. In an angina attack, you should feel better within 5 minutes after your first dose. You can take a dose every 5 minutes up to a total of 3 doses. If you do not feel better or feel worse after 1 dose, call 9-1-1 at once. Do not take more than 3 doses in 15 minutes. Your health care provider might give you other directions. Follow those directions if he or she does. Do not take your medicine more often than directed. Talk to your health care provider about the use of this medicine in children. Special care may be needed. Overdosage: If you think you have taken too much of this medicine contact a poison control center or emergency room at once. NOTE: This medicine is only for you. Do not share this medicine with others. What if I miss a dose? This does not apply. This medicine is only used as needed. What may interact with this medicine? Do not take this medicine with any of the following medications: certain migraine medicines like ergotamine and dihydroergotamine (DHE) medicines used to treat erectile dysfunction like sildenafil, tadalafil, and vardenafil riociguat This medicine may also interact with the following medications: alteplase aspirin heparin medicines for high blood pressure medicines for mental depression other medicines used to treat angina phenothiazines like chlorpromazine, mesoridazine,  prochlorperazine, thioridazine This list may not describe all possible interactions. Give your health care provider a list of all the medicines, herbs, non-prescription drugs, or dietary supplements you use. Also tell them if you smoke, drink alcohol, or use illegal drugs. Some items may interact with your medicine. What should I watch for while using this medicine? Tell your doctor or health care professional if you feel your medicine is no longer working. Keep this medicine with you at all times. Sit or lie down when you take your medicine to prevent falling if you feel dizzy or faint after using it. Try to remain calm. This will help you to feel better faster. If you feel dizzy, take several deep breaths and lie down with your feet propped up, or bend forward with your head resting between your knees. You may get drowsy or dizzy. Do not drive, use machinery, or do anything that needs mental alertness until you know how this drug affects you. Do not stand or sit up quickly, especially if you are an older patient. This reduces the risk of dizzy or fainting spells. Alcohol can make you more drowsy and dizzy. Avoid alcoholic drinks. Do not treat yourself for coughs, colds, or pain while you are taking this medicine without asking your doctor or health care professional for advice. Some ingredients may increase your blood pressure. What side effects may I notice from receiving this medicine? Side effects that you should report to your doctor or health care professional as soon as possible: allergic reactions (skin rash, itching or hives; swelling of the face, lips, or tongue) low blood pressure (dizziness; feeling  faint or lightheaded, falls; unusually weak or tired) low red blood cell counts (trouble breathing; feeling faint; lightheaded, falls; unusually weak or tired) Side effects that usually do not require medical attention (report to your doctor or health care professional if they continue or are  bothersome): facial flushing (redness) headache nausea, vomiting This list may not describe all possible side effects. Call your doctor for medical advice about side effects. You may report side effects to FDA at 1-800-FDA-1088. Where should I keep my medicine? Keep out of the reach of children. Store at room temperature between 20 and 25 degrees C (68 and 77 degrees F). Store in [employer]. Protect from light and moisture. Keep tightly closed. Throw away any unused medicine after the expiration date. NOTE: This sheet is a summary. It may not cover all possible information. If you have questions about this medicine, talk to your doctor, pharmacist, or health care provider.  2021 Elsevier/Gold Standard (2018-05-15 16:46:32)

## 2022-10-11 NOTE — H&P (View-Only) (Signed)
Cardiology Office Note:    Date:  10/11/2022   ID:  Amber Sexton, DOB [birth year], MRN [id number]  PCP:  Care, [place] Better  Cardiologist:  Jenean Lindau, MD   Referring MD: Care, [place]:    1. Coronary artery disease involving native coronary artery of native heart without angina pectoris   2. Essential hypertension   3. Sleep apnea, unspecified type   4. Mixed dyslipidemia   5. Morbid obesity ([place])    PLAN:    In order of problems listed above:  Angina pectoris: Coronary artery disease: Following recommendations were made to the patient.  Her symptoms appear to be significant.  Her family member was in the room throughout the visit.In view of the patient's symptoms, I discussed with the patient options for evaluation. Invasive and noninvasive options were given to the patient. I discussed stress testing and coronary angiography and left heart catheterization at length. Benefits, pros and cons of each approach were discussed at length. Patient had multiple questions which were answered to the patient's satisfaction. Patient opted for invasive evaluation and we will set up for coronary angiography and left heart catheterization. Further recommendations will be made based on the findings with coronary angiography. In the interim if the patient has any significant symptoms in hospital to the nearest emergency room.  Sublingual nitroglycerin prescription was sent, its protocol and 911 protocol explained and the patient vocalized understanding questions were answered to the patient's satisfaction Essential hypertension: Blood pressure stable and diet was emphasized.  Her blood pressure stable at home.  She appears anxious today.  She will keep a track of her blood pressures at home. Mixed dyslipidemia: On lipid-lowering medications.  Lipids were reviewed. Obesity: Weight reduction stressed and diet was emphasized and she promises to do better.  She  is still vaping and I advised her to quit so. Patient will be seen in follow-up appointment in 6 months or earlier if the patient has any concerns    Medication Adjustments/Labs and Tests Ordered: Current medicines are reviewed at length with the patient today.  Concerns regarding medicines are outlined above.  No orders of the defined types were placed in this encounter.  No orders of the defined types were placed in this encounter.    No chief complaint on file.    History of Present Illness:    Amber Sexton is a 67 y.o. female.  Patient has past medical history of coronary artery disease post stenting, essential hypertension, dyslipidemia and obesity.  Overall she leads a sedentary lifestyle.  She mentions to me that she has chest tightness on minimal exertion.  She is concerned about this.  She mentions to me that the symptoms are very similar to what she had before her coronary stent in the past.  No radiation to the neck or to the arms.  At the time of my evaluation, the patient is alert awake oriented and in no distress.  Entire visit was chaperoned by Alfredia Client, [place].  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  Report date is 11/15/2021.   The CancerNext-Expanded gene panel offered by [facility] and includes sequencing, rearrangement, and RNA analysis for the following 77  genes: AIP, ALK, APC, ATM, AXIN2, BAP1, BARD1, BLM, BMPR1A, BRCA1, BRCA2, BRIP1, CDC73, CDH1, CDK4, CDKN1B,    Meniere disease    Mixed dyslipidemia 03/02/2021   Morbid obesity ([place]) 11/03/2019   Obesity (BMI 35.0-39.9 without comorbidity) 07/20/2020   Osteoporosis 08/31/2021   Pulmonary hypertension ([place])    Sleep apnea 04/28/2019    Past Surgical History:  Procedure Laterality Date   ABDOMINAL HYSTERECTOMY     CORONARY STENT INTERVENTION N/A 06/09/2019   Procedure: CORONARY STENT INTERVENTION;  Surgeon: Belva Crome, MD;  Location: [place] [facility];  Service: Cardiovascular;  Laterality: N/A;   LEFT HEART CATH AND CORONARY ANGIOGRAPHY N/A 06/09/2019   Procedure: LEFT HEART CATH AND CORONARY ANGIOGRAPHY;  Surgeon: Belva Crome, MD;  Location: [place] [facility];  Service: Cardiovascular;  Laterality: N/A;   TONSILLECTOMY      Current Medications: Current Meds  Medication Sig   alendronate (FOSAMAX) 70 MG tablet Take 70 mg by mouth once a week.   ANORO ELLIPTA 62.5-25 MCG/INH AEPB Inhale 1 puff into the lungs daily.   aspirin EC 81 MG tablet Take 81 mg by mouth daily.   atorvastatin (LIPITOR) 10 MG tablet TAKE 1 TABLET BY MOUTH DAILY   Calcium Carb-Cholecalciferol (CALCIUM 600 + D PO) Take 1 tablet by mouth daily.   lansoprazole (PREVACID) 30 MG capsule Take 30 mg by mouth daily.   LUMIGAN 0.01 % SOLN Place 1 drop into both eyes daily.   nitroGLYCERIN (NITROSTAT) 0.4 MG SL tablet Place 1 tablet (0.4 mg total) under the tongue every 5 (five) minutes as needed for chest pain.     Allergies:   Ampicillin, Rosuvastatin, and Pitavastatin   Social History   Socioeconomic History   Marital status: Divorced    Spouse name: Not on file   Number of children: Not on file   Years of education: Not on file   Highest education level: Not on file  Occupational History   Not on file  Tobacco Use   Smoking status: Former    Packs/day: 1.00    Types: Cigarettes    Quit date: 2014     Years since quitting: 10.1   Smokeless tobacco: Never  Vaping Use   Vaping Use: Every day  Substance and Sexual Activity   Alcohol use: No   Drug use: No   Sexual activity: Not Currently  Other Topics Concern   Not on file  Social History Narrative   Not on file   Social Determinants of Health   Financial Resource Strain: Not on file  Food Insecurity: Not on file  Transportation Needs: Not on file  Physical Activity: Not on file  Stress: Not on file  Social Connections: Not on file     Family History: The patient's family history includes Breast cancer (age of onset: 37) in her sister; Colon cancer (age of onset: 15) in her daughter; Diabetes in her brother, brother, and sister; Hypertension in her brother, brother, and sister; Prostate cancer (age of onset: 48) in her brother; Uterine cancer (age of onset: 25) in her daughter.  ROS:   Please see the history of present illness.    All  other systems reviewed and are negative.  EKGs/Labs/Other Studies Reviewed:    The following studies were reviewed today: EKG reveals sinus rhythm and nonspecific ST-T changes   Recent Labs: 01/26/2022: ALT 30; BUN 14; Creatinine 0.7; Hemoglobin 13.3; Platelets 267; Potassium 3.9; Sodium 140  Recent Lipid Panel    Component Value Date/Time   CHOL 185 04/20/2021 0943   TRIG 126 04/20/2021 0943   HDL 53 04/20/2021 0943   CHOLHDL 3.5 04/20/2021 0943   LDLCALC 110 (H) 04/20/2021 0943    Physical Exam:    VS:  BP (!) 160/80   Pulse 89   Ht 5' 4"$  (1.626 m)   Wt 235 lb (106.6 kg)   SpO2 95%   BMI 40.34 kg/m     Wt Readings from Last 3 Encounters:  10/11/22 235 lb (106.6 kg)  05/31/22 232 lb 3.2 oz (105.3 kg)  01/26/22 230 lb 9.6 oz (104.6 kg)     GEN: Patient is in no acute distress HEENT: Normal NECK: No JVD; No carotid bruits LYMPHATICS: No lymphadenopathy CARDIAC: Hear sounds regular, 2/6 systolic murmur at the apex. RESPIRATORY:  Clear to auscultation without rales,  wheezing or rhonchi  ABDOMEN: Soft, non-tender, non-distended MUSCULOSKELETAL:  No edema; No deformity  SKIN: Warm and dry NEUROLOGIC:  Alert and oriented x 3 PSYCHIATRIC:  Normal affect   Signed, Jenean Lindau, MD  10/11/2022 10:57 AM    [place]

## 2022-10-12 ENCOUNTER — Telehealth: Payer: Self-pay | Admitting: *Deleted

## 2022-10-12 LAB — BASIC METABOLIC PANEL
BUN/Creatinine Ratio: 22 (ref 12–28)
BUN: 13 mg/dL (ref 8–27)
CO2: 23 mmol/L (ref 20–29)
Calcium: 9.7 mg/dL (ref 8.7–10.3)
Chloride: 101 mmol/L (ref 96–106)
Creatinine, Ser: 0.59 mg/dL (ref 0.57–1.00)
Glucose: 100 mg/dL — ABNORMAL HIGH (ref 70–99)
Potassium: 4.2 mmol/L (ref 3.5–5.2)
Sodium: 140 mmol/L (ref 134–144)
eGFR: 100 mL/min/{1.73_m2} (ref >59)

## 2022-10-12 LAB — CBC
Hematocrit: 41.9 % (ref 34.0–46.6)
Hemoglobin: 13.5 g/dL (ref 11.1–15.9)
MCH: 27.5 pg (ref 26.6–33.0)
MCHC: 32.2 g/dL (ref 31.5–35.7)
MCV: 85 fL (ref 79–97)
Platelets: 253 10*3/uL (ref 150–450)
RBC: 4.91 x10E6/uL (ref 3.77–5.28)
RDW: 13.3 % (ref 11.7–15.4)
WBC: 6 10*3/uL (ref 3.4–10.8)

## 2022-10-12 NOTE — Telephone Encounter (Signed)
Cardiac Catheterization scheduled at [facility] for: Monday October 16, 2022 9 AM Arrival time and place: [place] Entrance A at: 7 AM  Nothing to eat after midnight prior to procedure, clear liquids until 5 AM day of procedure.  Medication instructions: -Usual morning medications can be taken with sips of water including aspirin 81 mg.  Confirmed patient has responsible adult to drive home post procedure and be with patient first 24 hours after arriving home.  Patient reports no new symptoms concerning for COVID-19 in the past 10 days.  Reviewed procedure instructions with patient.

## 2022-10-16 ENCOUNTER — Ambulatory Visit (HOSPITAL_COMMUNITY)
Admission: RE | Admit: 2022-10-16 | Discharge: 2022-10-16 | Disposition: A | Discharge status: Home / Self Care | Payer: Medicare PPO | Attending: Internal Medicine | Admitting: Internal Medicine

## 2022-10-16 ENCOUNTER — Encounter (HOSPITAL_COMMUNITY)
Admission: RE | Disposition: A | Discharge status: Home / Self Care | Payer: Self-pay | Source: Home / Self Care | Attending: Internal Medicine

## 2022-10-16 ENCOUNTER — Other Ambulatory Visit: Payer: Self-pay

## 2022-10-16 DIAGNOSIS — I209 Angina pectoris, unspecified: Secondary | ICD-10-CM

## 2022-10-16 DIAGNOSIS — E785 Hyperlipidemia, unspecified: Secondary | ICD-10-CM | POA: Insufficient documentation

## 2022-10-16 DIAGNOSIS — F17290 Nicotine dependence, other tobacco product, uncomplicated: Secondary | ICD-10-CM | POA: Insufficient documentation

## 2022-10-16 DIAGNOSIS — Z6841 Body Mass Index (BMI) 40.0 and over, adult: Secondary | ICD-10-CM | POA: Diagnosis not present

## 2022-10-16 DIAGNOSIS — I10 Essential (primary) hypertension: Secondary | ICD-10-CM | POA: Diagnosis not present

## 2022-10-16 DIAGNOSIS — I25119 Atherosclerotic heart disease of native coronary artery with unspecified angina pectoris: Secondary | ICD-10-CM | POA: Insufficient documentation

## 2022-10-16 DIAGNOSIS — E782 Mixed hyperlipidemia: Secondary | ICD-10-CM | POA: Diagnosis not present

## 2022-10-16 DIAGNOSIS — G4730 Sleep apnea, unspecified: Secondary | ICD-10-CM | POA: Diagnosis not present

## 2022-10-16 DIAGNOSIS — Z955 Presence of coronary angioplasty implant and graft: Secondary | ICD-10-CM | POA: Diagnosis not present

## 2022-10-16 DIAGNOSIS — I2510 Atherosclerotic heart disease of native coronary artery without angina pectoris: Secondary | ICD-10-CM

## 2022-10-16 HISTORY — PX: LEFT HEART CATH AND CORONARY ANGIOGRAPHY: CATH118249

## 2022-10-16 HISTORY — PX: CORONARY PRESSURE/FFR STUDY: CATH118243

## 2022-10-16 LAB — POCT ACTIVATED CLOTTING TIME: Activated Clotting Time: 298 seconds

## 2022-10-16 SURGERY — LEFT HEART CATH AND CORONARY ANGIOGRAPHY
Anesthesia: LOCAL

## 2022-10-16 MED ORDER — SODIUM CHLORIDE 0.9% FLUSH: 3.0000 mL | INTRAVENOUS | Status: DC | PRN

## 2022-10-16 MED ORDER — VERAPAMIL HCL 2.5 MG/ML IV SOLN
INTRAVENOUS | Status: DC | PRN
  Administered 2022-10-16: 10 mL via INTRA_ARTERIAL

## 2022-10-16 MED ORDER — MIDAZOLAM HCL 2 MG/2ML IJ SOLN
INTRAMUSCULAR | Status: AC
  Filled 2022-10-16: qty 2

## 2022-10-16 MED ORDER — ASPIRIN 81 MG PO CHEW: 81.0000 mg | CHEWABLE_TABLET | ORAL | Status: DC

## 2022-10-16 MED ORDER — HYDRALAZINE HCL 20 MG/ML IJ SOLN: 10.0000 mg | INTRAMUSCULAR | Status: DC | PRN

## 2022-10-16 MED ORDER — MIDAZOLAM HCL 2 MG/2ML IJ SOLN
INTRAMUSCULAR | Status: DC | PRN
  Administered 2022-10-16 (×3): 1 mg via INTRAVENOUS

## 2022-10-16 MED ORDER — FENTANYL CITRATE (PF) 100 MCG/2ML IJ SOLN
INTRAMUSCULAR | Status: DC | PRN
  Administered 2022-10-16 (×3): 25 ug via INTRAVENOUS

## 2022-10-16 MED ORDER — LABETALOL HCL 5 MG/ML IV SOLN
10.0000 mg | INTRAVENOUS | Status: DC | PRN
  Administered 2022-10-16: 10 mg via INTRAVENOUS

## 2022-10-16 MED ORDER — FUROSEMIDE 20 MG PO TABS: 20.0000 mg | ORAL_TABLET | Freq: Every day | ORAL | 11 refills | Status: DC

## 2022-10-16 MED ORDER — SODIUM CHLORIDE 0.9 % IV SOLN: INTRAVENOUS | Status: DC

## 2022-10-16 MED ORDER — LIDOCAINE HCL (PF) 1 % IJ SOLN
INTRAMUSCULAR | Status: DC | PRN
  Administered 2022-10-16 (×2): 2 mL

## 2022-10-16 MED ORDER — LIDOCAINE HCL (PF) 1 % IJ SOLN
INTRAMUSCULAR | Status: AC
  Filled 2022-10-16: qty 30

## 2022-10-16 MED ORDER — SODIUM CHLORIDE 0.9% FLUSH: 3.0000 mL | Freq: Two times a day (BID) | INTRAVENOUS | Status: DC

## 2022-10-16 MED ORDER — SODIUM CHLORIDE 0.9 % WEIGHT BASED INFUSION
3.0000 mL/kg/h | INTRAVENOUS | Status: AC
  Administered 2022-10-16: 3 mL/kg/h via INTRAVENOUS

## 2022-10-16 MED ORDER — HEPARIN SODIUM (PORCINE) 1000 UNIT/ML IJ SOLN
INTRAMUSCULAR | Status: DC | PRN
  Administered 2022-10-16 (×2): 5000 [IU] via INTRA_ARTERIAL

## 2022-10-16 MED ORDER — VERAPAMIL HCL 2.5 MG/ML IV SOLN
INTRAVENOUS | Status: AC
  Filled 2022-10-16: qty 2

## 2022-10-16 MED ORDER — ONDANSETRON HCL 4 MG/2ML IJ SOLN: 4.0000 mg | Freq: Four times a day (QID) | INTRAMUSCULAR | Status: DC | PRN

## 2022-10-16 MED ORDER — ADENOSINE (DIAGNOSTIC) 140MCG/KG/MIN
INTRAVENOUS | Status: DC | PRN
  Administered 2022-10-16: 140 ug/kg/min via INTRAVENOUS

## 2022-10-16 MED ORDER — ACETAMINOPHEN 325 MG PO TABS: 650.0000 mg | ORAL_TABLET | ORAL | Status: DC | PRN

## 2022-10-16 MED ORDER — HEPARIN SODIUM (PORCINE) 1000 UNIT/ML IJ SOLN
INTRAMUSCULAR | Status: AC
  Filled 2022-10-16: qty 10

## 2022-10-16 MED ORDER — IOHEXOL 350 MG/ML SOLN
INTRAVENOUS | Status: DC | PRN
  Administered 2022-10-16: 45 mL via INTRA_ARTERIAL

## 2022-10-16 MED ORDER — FENTANYL CITRATE (PF) 100 MCG/2ML IJ SOLN
INTRAMUSCULAR | Status: AC
  Filled 2022-10-16: qty 2

## 2022-10-16 MED ORDER — SODIUM CHLORIDE 0.9 % IV SOLN: 250.0000 mL | INTRAVENOUS | Status: DC | PRN

## 2022-10-16 MED ORDER — SODIUM CHLORIDE 0.9 % WEIGHT BASED INFUSION: 1.0000 mL/kg/h | INTRAVENOUS | Status: DC

## 2022-10-16 MED ORDER — HEPARIN (PORCINE) IN NACL 1000-0.9 UT/500ML-% IV SOLN
INTRAVENOUS | Status: DC | PRN
  Administered 2022-10-16 (×2): 500 mL

## 2022-10-16 SURGICAL SUPPLY — 15 items
CATH DIAG 6FR PIGTAIL ANGLED (CATHETERS) IMPLANT
CATH OPTITORQUE TIG 4.0 6F (CATHETERS) IMPLANT
CATH VISTA GUIDE 6FR XBLAD3.5 (CATHETERS) IMPLANT
DEVICE RAD COMP TR BAND LRG (VASCULAR PRODUCTS) IMPLANT
GLIDESHEATH SLEND SS 6F .021 (SHEATH) IMPLANT
GUIDEWIRE INQWIRE 1.5J.035X260 (WIRE) IMPLANT
GUIDEWIRE PRESSURE X 175 (WIRE) IMPLANT
INQWIRE 1.5J .035X260CM (WIRE) ×1
KIT ESSENTIALS PG (KITS) IMPLANT
KIT HEART LEFT (KITS) ×1 IMPLANT
PACK CARDIAC CATHETERIZATION (CUSTOM PROCEDURE TRAY) ×1 IMPLANT
SHEATH PROBE COVER 6X72 (BAG) IMPLANT
TRANSDUCER W/STOPCOCK (MISCELLANEOUS) ×1 IMPLANT
TUBING CIL FLEX 10 FLL-RA (TUBING) ×1 IMPLANT
WIRE HI TORQ VERSACORE-J 145CM (WIRE) IMPLANT

## 2022-10-16 NOTE — Interval H&P Note (Signed)
History and Physical Interval Note:  10/16/2022 7:07 AM  Amber Sexton  has presented today for surgery, with the diagnosis of angina.  The various methods of treatment have been discussed with the patient and family. After consideration of risks, benefits and other options for treatment, the patient has consented to  Procedure(s): LEFT HEART CATH AND CORONARY ANGIOGRAPHY (N/A) as a surgical intervention.  The patient's history has been reviewed, patient examined, no change in status, stable for surgery.  I have reviewed the patient's chart and labs.  Questions were answered to the patient's satisfaction.    Cath Lab Visit (complete for each Cath Lab visit)  Clinical Evaluation Leading to the Procedure:   ACS: No.  Non-ACS:    Anginal Classification: CCS II  Anti-ischemic medical therapy: Minimal Therapy (1 class of medications)  Non-Invasive Test Results: No non-invasive testing performed  Prior CABG: No previous CABG        Early Osmond

## 2022-10-16 NOTE — Progress Notes (Signed)
TR BAND REMOVAL  LOCATION:    right radial  DEFLATED PER PROTOCOL:    Yes.    TIME BAND OFF / DRESSING APPLIED:    1345 Gauze dressing applied    SITE UPON ARRIVAL:    Level 0  SITE AFTER BAND REMOVAL:    Level 0  CIRCULATION SENSATION AND MOVEMENT:    Within Normal Limits   Yes.    COMMENTS:   no issues noted

## 2022-10-16 NOTE — Progress Notes (Signed)
Called for right forearm hematoma. Pressure was being help on my arrival. Minimal hematoma present proximal to tr band. TR band deflated and repositioned 1/4 inch proximally using a blood pressure cuff.   Tr band reinflated to 20cc. No additional hematoma noted, reverse allen test performed showing pleth waveform with the ulnar radial compressed.   Bedrest begins at 11:03:00

## 2022-10-16 NOTE — Discharge Instructions (Addendum)
Start lasix 79m daily  Radial Site Care Drink plenty of fluid for the next 3 days  Keep arm elevated for the next 24 hours The following information offers guidance on how to care for yourself after your procedure. Your health care provider may also give you more specific instructions. If you have problems or questions, contact your health care provider. What can I expect after the procedure? After the procedure, it is common to have bruising and tenderness in the incision area. Follow these instructions at home: Incision site care  Follow instructions from your health care provider about how to take care of your incision site. Make sure you:  Remove your dressing 24 hours after discharge.  Do not take baths, swim, or use a hot tub for 1 week. You may shower 24 hours after the procedure or as told by your health care provider. Remove the dressing and gently wash the incision area with plain soap and water. Pat the area dry with a clean towel. Do not rub the site. That could cause bleeding. Do not apply powder or lotion to the site. Check your incision site every day for signs of infection. Check for: Redness, swelling, or pain. Fluid or blood. Warmth. Pus or a bad smell. Activity For 24 hours after the procedure, or as directed by your health care provider: Do not flex or bend the affected arm. Do not push or pull heavy objects with the affected arm. Do not operate machinery or power tools. Do not drive. You should not drive yourself home from the hospital or clinic if you go home during that time period. You may drive 24 hours after the procedure unless your health care provider tells you not to. Do not lift anything that is heavier than 10 lb (4.5 kg), or the limit that you are told, until your health care provider says that it is safe. Return to your normal activities as told by your health care provider. Ask your health care provider what activities are safe for you and when you  can return to work. If you were given a sedative during the procedure, it can affect you for several hours. Do not drive or operate machinery until your health care provider says that it is safe. General instructions Take over-the-counter and prescription medicines only as told by your health care provider. If you will be going home right after the procedure, plan to have a responsible adult care for you for the time you are told. This is important. Keep all follow-up visits. This is important. Contact a health care provider if: You have a fever or chills. You have any of these signs of infection at your incision site: Redness, swelling, or pain. Fluid or blood. Warmth. Pus or a bad smell. Get help right away if: The incision area swells very fast. The incision area is bleeding, and the bleeding does not stop when you hold steady pressure on the area. Your arm or hand becomes pale, cool, tingly, or numb. These symptoms may represent a serious problem that is an emergency. Do not wait to see if the symptoms will go away. Get medical help right away. Call your local emergency services (911 in the [place] Do not drive yourself to the hospital. Summary After the procedure, it is common to have bruising and tenderness at the incision site. Follow instructions from your health care provider about how to take care of your radial site incision. Check the incision every day for signs of infection. Do  not lift anything that is heavier than 10 lb (4.5 kg), or the limit that you are told, until your health care provider says that it is safe. Get help right away if the incision area swells very fast, you have bleeding at the incision site that will not stop, or your arm or hand becomes pale, cool, or numb. This information is not intended to replace advice given to you by your health care provider. Make sure you discuss any questions you have with your health care provider. Document Revised: 10/03/2020  Document Reviewed: 10/03/2020 Elsevier Patient Education  [place].

## 2022-10-17 ENCOUNTER — Encounter (HOSPITAL_COMMUNITY): Payer: Self-pay | Admitting: Internal Medicine

## 2022-11-15 ENCOUNTER — Ambulatory Visit: Payer: Medicare PPO | Attending: Cardiology | Admitting: Cardiology

## 2022-11-15 ENCOUNTER — Encounter: Payer: Self-pay | Admitting: Cardiology

## 2022-11-15 VITALS — BP 124/86 | HR 80 | Ht 64.0 in | Wt 235.1 lb

## 2022-11-15 DIAGNOSIS — G4730 Sleep apnea, unspecified: Secondary | ICD-10-CM

## 2022-11-15 DIAGNOSIS — E782 Mixed hyperlipidemia: Secondary | ICD-10-CM

## 2022-11-15 DIAGNOSIS — I272 Pulmonary hypertension, unspecified: Secondary | ICD-10-CM | POA: Diagnosis not present

## 2022-11-15 DIAGNOSIS — J431 Panlobular emphysema: Secondary | ICD-10-CM

## 2022-11-15 DIAGNOSIS — I2510 Atherosclerotic heart disease of native coronary artery without angina pectoris: Secondary | ICD-10-CM | POA: Diagnosis not present

## 2022-11-15 DIAGNOSIS — I10 Essential (primary) hypertension: Secondary | ICD-10-CM | POA: Diagnosis not present

## 2022-11-15 NOTE — Progress Notes (Signed)
Cardiology Office Note:    Date:  11/15/2022   ID:  Amber Sexton, DOB July 18, 1956, MRN [id number]  PCP:  Care, [place] Better  Cardiologist:  Jenean Lindau, MD   Referring MD: Care, [place]:    1. Coronary artery disease involving native coronary artery of native heart without angina pectoris   2. Essential hypertension   3. Pulmonary hypertension ([place])   4. Panlobular emphysema ([place])   5. Sleep apnea, unspecified type   6. Mixed dyslipidemia   7. Morbid obesity ([place])    PLAN:    In order of problems listed above:  Coronary artery disease: Secondary prevention stressed with patient.  Importance of compliance with diet medication stressed and she vocalized understanding.  She was advised to walk on a regular basis and she promises to do so. Mixed dyslipidemia: On lipid-lowering medications followed by primary.  Lifestyle modification urged.   Morbid obesity: Risks of obesity explained weight reduction stressed and patient promises to do better. Patient will be seen in follow-up appointment in 6 months or earlier if the patient has any concerns.    Medication Adjustments/Labs and Tests Ordered: Current medicines are reviewed at length with the patient today.  Concerns regarding medicines are outlined above.  No orders of the defined types were placed in this encounter.  No orders of the defined types were placed in this encounter.    No chief complaint on file.    History of Present Illness:    Amber Sexton is a 67 y.o. female.  Patient has past medical history of coronary artery disease, essential hypertension, mixed dyslipidemia and morbid obesity.  She underwent coronary angiography which revealed mild disease.  No chest pain orthopnea or PND.  She is trying to ambulate on a daily basis.  Overall she has a sedentary lifestyle.  At the time of my evaluation, the patient is alert awake oriented and in no distress.  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  Report date is 11/15/2021.   The CancerNext-Expanded gene panel offered by [facility] and includes sequencing, rearrangement, and RNA analysis for the following 77 genes: AIP, ALK, APC, ATM, AXIN2, BAP1, BARD1, BLM, BMPR1A, BRCA1, BRCA2, BRIP1, CDC73, CDH1, CDK4, CDKN1B,    Meniere disease    Mixed dyslipidemia 03/02/2021   Morbid obesity ([place]) 11/03/2019   Obesity (BMI 35.0-39.9 without comorbidity) 07/20/2020   Osteoporosis 08/31/2021   Pulmonary hypertension ([place])    Sleep apnea 04/28/2019    Past Surgical History:  Procedure Laterality Date   ABDOMINAL HYSTERECTOMY     CORONARY STENT INTERVENTION N/A 06/09/2019   Procedure: CORONARY STENT INTERVENTION;  Surgeon: Belva Crome, MD;  Location: [place] [facility];  Service: Cardiovascular;  Laterality: N/A;   INTRAVASCULAR PRESSURE WIRE/FFR STUDY N/A 10/16/2022   Procedure: INTRAVASCULAR PRESSURE WIRE/FFR STUDY;  Surgeon: Early Osmond, MD;  Location: [place]  [facility];  Service: Cardiovascular;  Laterality: N/A;   LEFT HEART CATH AND CORONARY ANGIOGRAPHY N/A 06/09/2019   Procedure: LEFT HEART CATH AND CORONARY ANGIOGRAPHY;  Surgeon: Belva Crome, MD;  Location: [place] [facility];  Service: Cardiovascular;  Laterality: N/A;   LEFT HEART CATH AND CORONARY ANGIOGRAPHY N/A 10/16/2022   Procedure: LEFT  HEART CATH AND CORONARY ANGIOGRAPHY;  Surgeon: Early Osmond, MD;  Location: [place] [facility];  Service: Cardiovascular;  Laterality: N/A;   TONSILLECTOMY      Current Medications: Current Meds  Medication Sig   alendronate (FOSAMAX) 70 MG tablet Take 70 mg by mouth once a week.   aspirin EC 81 MG tablet Take 81 mg by mouth daily.   atorvastatin (LIPITOR) 10 MG tablet TAKE 1 TABLET BY MOUTH DAILY   BEVESPI AEROSPHERE 9-4.8 MCG/ACT AERO Inhale 2 puffs into the lungs 2 (two) times daily.   Calcium Carb-Cholecalciferol (CALCIUM 600 + D PO) Take 1 tablet by mouth daily.   furosemide (LASIX) 20 MG tablet Take 1 tablet (20 mg total) by mouth daily.   lansoprazole (PREVACID) 30 MG capsule Take 30 mg by mouth daily as needed (Heartburn).   LUMIGAN 0.01 % SOLN Place 1 drop into both eyes daily.   nitroGLYCERIN (NITROSTAT) 0.4 MG SL tablet Place 1 tablet (0.4 mg total) under the tongue every 5 (five) minutes as needed for chest pain.     Allergies:   Ampicillin, Rosuvastatin, and Pitavastatin   Social History   Socioeconomic History   Marital status: Divorced    Spouse name: Not on file   Number of children: Not on file   Years of education: Not on file   Highest education level: Not on file  Occupational History   Not on file  Tobacco Use   Smoking status: Former    Packs/day: 1    Types: Cigarettes    Quit date: 2014    Years since quitting: 10.2   Smokeless tobacco: Never  Vaping Use   Vaping Use: Every day  Substance and Sexual Activity   Alcohol use: No   Drug use: No   Sexual activity: Not Currently  Other Topics Concern   Not on file  Social History Narrative   Not on file   Social Determinants of Health   Financial Resource Strain: Not on file  Food Insecurity: Not on file  Transportation Needs: Not on file  Physical Activity: Not on file  Stress: Not on file  Social Connections: Not on file     Family History: The patient's family history includes Breast  cancer (age of onset: 68) in her sister; Colon cancer (age of onset: 104) in her daughter; Diabetes in her brother, brother, and sister; Hypertension in her brother, brother, and sister; Prostate cancer (age of onset: 29) in her brother; Uterine cancer (age of onset: 32) in her daughter.  ROS:   Please see the history of present illness.    All other systems reviewed and are negative.  EKGs/Labs/Other Studies Reviewed:    The following studies were reviewed today: I discussed coronary angiography report with the patient.  It revealed at this time only mild stenosis.   Recent Labs: 01/26/2022: ALT 30 10/11/2022: BUN 13; Creatinine, Ser 0.59; Hemoglobin 13.5; Platelets 253; Potassium 4.2; Sodium 140  Recent Lipid Panel    Component Value Date/Time   CHOL 185 04/20/2021 0943   TRIG 126 04/20/2021 0943   HDL 53 04/20/2021 0943   CHOLHDL 3.5 04/20/2021  [id number]   LDLCALC 110 (H) 04/20/2021 0943    Physical Exam:    VS:  BP 124/86   Pulse 80   Ht 5\' 4"  (1.626 m)   Wt 235 lb 1.9 oz (106.6 kg)   SpO2 96%   BMI 40.36 kg/m     Wt Readings from Last 3 Encounters:  11/15/22 235 lb 1.9 oz (106.6 kg)  10/16/22 235 lb (106.6 kg)  10/11/22 235 lb (106.6 kg)     GEN: Patient is in no acute distress HEENT: Normal NECK: No JVD; No carotid bruits LYMPHATICS: No lymphadenopathy CARDIAC: Hear sounds regular, 2/6 systolic murmur at the apex. RESPIRATORY:  Clear to auscultation without rales, wheezing or rhonchi  ABDOMEN: Soft, non-tender, non-distended MUSCULOSKELETAL:  No edema; No deformity  SKIN: Warm and dry NEUROLOGIC:  Alert and oriented x 3 PSYCHIATRIC:  Normal affect   Signed, Jenean Lindau, MD  11/15/2022 11:44 AM    [place]

## 2022-11-15 NOTE — Patient Instructions (Signed)
Medication Instructions:  Your physician has recommended you make the following change in your medication:   Stop Lasix (furosemide)  *If you need a refill on your cardiac medications before your next appointment, please call your pharmacy*   Lab Work: None ordered If you have labs (blood work) drawn today and your tests are completely normal, you will receive your results only by: [place] (if you have MyChart) OR A paper copy in the mail If you have any lab test that is abnormal or we need to change your treatment, we will call you to review the results.   Testing/Procedures: None ordered   Follow-Up: At [facility], you and your health needs are our priority.  As part of our continuing mission to provide you with exceptional heart care, we have created designated Provider Care Teams.  These Care Teams include your primary Cardiologist (physician) and Advanced Practice Providers (APPs -  Physician Assistants and Nurse Practitioners) who all work together to provide you with the care you need, when you need it.  We recommend signing up for the patient portal called "MyChart".  Sign up information is provided on this After Visit Summary.  MyChart is used to connect with patients for Virtual Visits (Telemedicine).  Patients are able to view lab/test results, encounter notes, upcoming appointments, etc.  Non-urgent messages can be sent to your provider as well.   To learn more about what you can do with MyChart, go to [email or web address].    Your next appointment:   9 month(s)  The format for your next appointment:   In Person  Provider:   Jyl Heinz, MD    Other Instructions none  Important Information About Sugar

## 2022-11-22 ENCOUNTER — Other Ambulatory Visit: Payer: Self-pay

## 2022-11-22 MED ORDER — NITROGLYCERIN 0.4 MG SL SUBL: 0.4000 mg | SUBLINGUAL_TABLET | SUBLINGUAL | 2 refills | Status: DC | PRN

## 2022-11-22 MED ORDER — ATORVASTATIN CALCIUM 10 MG PO TABS: 10.0000 mg | ORAL_TABLET | Freq: Every day | ORAL | 12 refills | Status: DC

## 2022-11-24 MED ORDER — ATORVASTATIN CALCIUM 10 MG PO TABS: 10.0000 mg | ORAL_TABLET | Freq: Every day | ORAL | 3 refills | Status: DC

## 2022-11-24 MED ORDER — NITROGLYCERIN 0.4 MG SL SUBL: 0.4000 mg | SUBLINGUAL_TABLET | SUBLINGUAL | 11 refills | Status: DC | PRN

## 2022-11-24 MED ORDER — LANSOPRAZOLE 30 MG PO CPDR: 30.0000 mg | DELAYED_RELEASE_CAPSULE | Freq: Every day | ORAL | 3 refills | Status: DC | PRN

## 2022-11-24 NOTE — Addendum Note (Signed)
Addended by: Natalea Sutliff, Jonelle Sidle L on: 11/24/2022 11:17 AM   Modules accepted: Orders

## 2022-12-19 LAB — COMPREHENSIVE METABOLIC PANEL: EGFR: 91

## 2022-12-19 LAB — HEMOGLOBIN A1C: A1c: 6.1

## 2023-02-01 DIAGNOSIS — H353133 Nonexudative age-related macular degeneration, bilateral, advanced atrophic without subfoveal involvement: Secondary | ICD-10-CM | POA: Diagnosis not present

## 2023-04-13 DIAGNOSIS — H353133 Nonexudative age-related macular degeneration, bilateral, advanced atrophic without subfoveal involvement: Secondary | ICD-10-CM | POA: Diagnosis not present

## 2023-04-13 DIAGNOSIS — H401131 Primary open-angle glaucoma, bilateral, mild stage: Secondary | ICD-10-CM | POA: Diagnosis not present

## 2023-04-27 DIAGNOSIS — H401111 Primary open-angle glaucoma, right eye, mild stage: Secondary | ICD-10-CM | POA: Diagnosis not present

## 2023-06-20 DIAGNOSIS — H401131 Primary open-angle glaucoma, bilateral, mild stage: Secondary | ICD-10-CM | POA: Diagnosis not present

## 2023-06-29 DIAGNOSIS — H401121 Primary open-angle glaucoma, left eye, mild stage: Secondary | ICD-10-CM | POA: Diagnosis not present

## 2023-08-02 DIAGNOSIS — R7303 Prediabetes: Secondary | ICD-10-CM | POA: Diagnosis not present

## 2023-08-02 DIAGNOSIS — Z87891 Personal history of nicotine dependence: Secondary | ICD-10-CM | POA: Diagnosis not present

## 2023-08-02 DIAGNOSIS — Z23 Encounter for immunization: Secondary | ICD-10-CM | POA: Diagnosis not present

## 2023-08-02 DIAGNOSIS — M810 Age-related osteoporosis without current pathological fracture: Secondary | ICD-10-CM | POA: Diagnosis not present

## 2023-08-02 DIAGNOSIS — J449 Chronic obstructive pulmonary disease, unspecified: Secondary | ICD-10-CM | POA: Diagnosis not present

## 2023-08-02 DIAGNOSIS — I2510 Atherosclerotic heart disease of native coronary artery without angina pectoris: Secondary | ICD-10-CM | POA: Diagnosis not present

## 2023-08-02 DIAGNOSIS — Z139 Encounter for screening, unspecified: Secondary | ICD-10-CM | POA: Diagnosis not present

## 2023-08-27 DIAGNOSIS — Z9181 History of falling: Secondary | ICD-10-CM | POA: Diagnosis not present

## 2023-08-27 DIAGNOSIS — Z139 Encounter for screening, unspecified: Secondary | ICD-10-CM | POA: Diagnosis not present

## 2023-08-27 DIAGNOSIS — Z0000 Encounter for general adult medical examination without abnormal findings: Secondary | ICD-10-CM | POA: Diagnosis not present

## 2023-08-27 DIAGNOSIS — Z1331 Encounter for screening for depression: Secondary | ICD-10-CM | POA: Diagnosis not present

## 2023-09-12 ENCOUNTER — Other Ambulatory Visit: Payer: Self-pay | Admitting: Cardiology

## 2023-09-13 DIAGNOSIS — Z122 Encounter for screening for malignant neoplasm of respiratory organs: Secondary | ICD-10-CM | POA: Diagnosis not present

## 2023-09-13 DIAGNOSIS — Z87891 Personal history of nicotine dependence: Secondary | ICD-10-CM | POA: Diagnosis not present

## 2023-09-14 DIAGNOSIS — Z87891 Personal history of nicotine dependence: Secondary | ICD-10-CM | POA: Diagnosis not present

## 2023-09-14 DIAGNOSIS — R911 Solitary pulmonary nodule: Secondary | ICD-10-CM | POA: Diagnosis not present

## 2023-09-14 DIAGNOSIS — Z0489 Encounter for examination and observation for other specified reasons: Secondary | ICD-10-CM | POA: Diagnosis not present

## 2023-09-14 DIAGNOSIS — Z122 Encounter for screening for malignant neoplasm of respiratory organs: Secondary | ICD-10-CM | POA: Diagnosis not present

## 2023-09-14 DIAGNOSIS — H401131 Primary open-angle glaucoma, bilateral, mild stage: Secondary | ICD-10-CM | POA: Diagnosis not present

## 2023-09-26 ENCOUNTER — Other Ambulatory Visit: Payer: Self-pay | Admitting: Cardiology

## 2023-10-11 DIAGNOSIS — R928 Other abnormal and inconclusive findings on diagnostic imaging of breast: Secondary | ICD-10-CM | POA: Diagnosis not present

## 2023-10-11 DIAGNOSIS — Z853 Personal history of malignant neoplasm of breast: Secondary | ICD-10-CM | POA: Diagnosis not present

## 2023-10-11 DIAGNOSIS — R92323 Mammographic fibroglandular density, bilateral breasts: Secondary | ICD-10-CM | POA: Diagnosis not present

## 2023-10-27 ENCOUNTER — Other Ambulatory Visit: Payer: Self-pay | Admitting: Cardiology

## 2023-11-04 ENCOUNTER — Other Ambulatory Visit: Payer: Self-pay | Admitting: Cardiology

## 2023-11-11 ENCOUNTER — Other Ambulatory Visit: Payer: Self-pay | Admitting: Cardiology

## 2023-11-19 ENCOUNTER — Other Ambulatory Visit: Payer: Self-pay | Admitting: Cardiology

## 2023-11-27 ENCOUNTER — Other Ambulatory Visit: Payer: Self-pay | Admitting: Cardiology

## 2023-11-27 NOTE — Telephone Encounter (Signed)
 Prescription sent to pharmacy.

## 2023-12-05 ENCOUNTER — Other Ambulatory Visit: Payer: Self-pay | Admitting: Cardiology

## 2024-01-25 ENCOUNTER — Other Ambulatory Visit: Payer: Self-pay | Admitting: Cardiology

## 2024-02-07 ENCOUNTER — Ambulatory Visit: Attending: Cardiology | Admitting: Cardiology

## 2024-02-07 ENCOUNTER — Encounter: Payer: Self-pay | Admitting: Cardiology

## 2024-02-07 VITALS — BP 130/80 | HR 73 | Ht 64.0 in | Wt 230.0 lb

## 2024-02-07 DIAGNOSIS — I10 Essential (primary) hypertension: Secondary | ICD-10-CM | POA: Diagnosis not present

## 2024-02-07 DIAGNOSIS — E782 Mixed hyperlipidemia: Secondary | ICD-10-CM

## 2024-02-07 DIAGNOSIS — J431 Panlobular emphysema: Secondary | ICD-10-CM | POA: Diagnosis not present

## 2024-02-07 DIAGNOSIS — H353133 Nonexudative age-related macular degeneration, bilateral, advanced atrophic without subfoveal involvement: Secondary | ICD-10-CM | POA: Diagnosis not present

## 2024-02-07 DIAGNOSIS — I2510 Atherosclerotic heart disease of native coronary artery without angina pectoris: Secondary | ICD-10-CM

## 2024-02-07 NOTE — Patient Instructions (Signed)

## 2024-02-07 NOTE — Progress Notes (Signed)
 Cardiology Office Note:    Date:  02/07/2024   ID:  Amber Sexton, DOB 1956/07/15, MRN [id number]  PCP:  Care, [facility]  Cardiologist:  Nelia Balzarine, MD   Referring MD: Care, [facility]*    ASSESSMENT:    1. Essential hypertension   2. Panlobular emphysema (HCC)   3. Mixed dyslipidemia   4. Morbid obesity (HCC)   5. Coronary artery disease involving native coronary artery of native heart without angina pectoris    PLAN:    In order of problems listed above:  Coronary artery disease: Secondary prevention stressed with the patient.  Importance of compliance with diet medication stressed and she vocalized understanding.  She was advised to ambulate to the best of her ability. Mixed dyslipidemia: On lipid-lowering medications followed by primary care.  Lipids reviewed and discussed with patient. Essential hypertension: Blood pressure is stable and diet was emphasized. Morbid obesity: Weight reduction stressed diet emphasized and she promises to do better.  Risks of obesity explained. Patient will be seen in follow-up appointment in 6 months or earlier if the patient has any concerns.    Medication Adjustments/Labs and Tests Ordered: Current medicines are reviewed at length with the patient today.  Concerns regarding medicines are outlined above.  Orders Placed This Encounter  Procedures   EKG 12-Lead   No orders of the defined types were placed in this encounter.    Chief Complaint  Patient presents with   Follow-up     History of Present Illness:    Amber Sexton is a 68 y.o. female.  Patient has past medical history of coronary artery disease, COPD, essential hypertension, mixed dyslipidemia and morbid obesity.  She denies any problems at this time and takes care of activities of daily living.  No chest pain orthopnea or PND.Aaron Aas  At the time of my evaluation, the patient is alert awake oriented and in no distress.  Overall she leads a  sedentary lifestyle.  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  Report date is 11/15/2021.   The CancerNext-Expanded gene panel offered by [facility] and includes sequencing, rearrangement, and RNA analysis for the following 77 genes: AIP, ALK, APC, ATM, AXIN2, BAP1, BARD1, BLM, BMPR1A, BRCA1, BRCA2, BRIP1, CDC73, CDH1, CDK4, CDKN1B,    Meniere disease    Mixed dyslipidemia 03/02/2021   Morbid obesity (HCC) 11/03/2019   Obesity (BMI 35.0-39.9 without comorbidity) 07/20/2020   Osteoporosis 08/31/2021   Pulmonary hypertension (HCC)    Sleep apnea 04/28/2019    Past Surgical History:  Procedure Laterality Date   ABDOMINAL HYSTERECTOMY     CORONARY PRESSURE/FFR STUDY N/A 10/16/2022   Procedure: INTRAVASCULAR PRESSURE WIRE/FFR STUDY;  Surgeon: Kyra Phy, MD;  [facility] INVAS[facility];  Service: Cardiovascular;  Laterality: N/A;   CORONARY STENT INTERVENTION N/A 06/09/2019   Procedure: CORONARY STENT INTERVENTION;  Surgeon: Arty Binning, MD;  Location[facility]  INVASIVE CV LAB;  Service: Cardiovascular;  Laterality: N/A;   LEFT HEART CATH AND CORONARY ANGIOGRAPHY N/A 06/09/2019   Procedure: LEFT HEART CATH AND CORONARY ANGIOGRAPHY;  Surgeon: Arty Binning, MD;  Location: [facility] INVASIVE CV LAB;  Service: Cardiovascular;  Laterality: N/A;   LEFT HEART CATH AND CORONARY ANGIOGRAPHY N/A 10/16/2022    Procedure: LEFT HEART CATH AND CORONARY ANGIOGRAPHY;  Surgeon: Kyra Phy, MD;  Location: [facility] INVASIVE CV LAB;  Service: Cardiovascular;  Laterality: N/A;   TONSILLECTOMY      Current Medications: Current Meds  Medication Sig   albuterol  (PROAIR  HFA) 108 (90 Base) MCG/ACT inhaler Inhale 2 puffs into the lungs every 6 (six) hours as needed for wheezing or shortness of breath.   alendronate (FOSAMAX) 70 MG tablet Take 70 mg by mouth once a week.   aspirin  EC 81 MG tablet Take 81 mg by mouth daily.   atorvastatin  (LIPITOR ) 10 MG tablet Take 1 tablet (10 mg total) by mouth daily. Patient needs to make an appointment for further refills. 2nd attempt.   Calcium  Carb-Cholecalciferol (CALCIUM  600 + D PO) Take 1 tablet by mouth daily.   lansoprazole  (PREVACID ) 30 MG capsule Take 1 capsule (30 mg total) by mouth daily as needed (heartburn). Patient must keep appointment on 02/07/24 for further refills. 3 rd/final attempt   LUMIGAN 0.01 % SOLN Place 1 drop into both eyes daily.   nitroGLYCERIN  (NITROSTAT ) 0.4 MG SL tablet Place 1 tablet (0.4 mg total) under the tongue every 5 (five) minutes as needed for chest pain. Patient must keep appointment on 02/07/24 for further refills. 3 rd/final attempt   TRELEGY ELLIPTA 100-62.5-25 MCG/ACT AEPB Inhale 1 puff into the lungs daily.     Allergies:   Ampicillin, Rosuvastatin , and Pitavastatin    Social History   Socioeconomic History   Marital status: Divorced    Spouse name: Not on file   Number of children: Not on file   Years of education: Not on file   Highest education level: Not on file  Occupational History   Not on file  Tobacco Use   Smoking status: Former    Current packs/day: 0.00    Types: Cigarettes    Quit date: 2014    Years since quitting: 11.4   Smokeless tobacco: Never  Vaping Use   Vaping status: Every Day  Substance and Sexual Activity   Alcohol use: No   Drug use: No   Sexual activity: Not Currently  Other Topics Concern    Not on file  Social History Narrative   Not on file   Social Drivers of Health   Financial Resource Strain: Not on file  Food Insecurity: Not on file  Transportation Needs: Not on file  Physical Activity: Not on file  Stress: Not on file  Social Connections: Not on file     Family History: The patient's family history includes Breast cancer (age of onset: 31) in her sister; Colon cancer (age of onset: 42) in her daughter; Diabetes in her brother, brother, and sister; Hypertension in her brother, brother, and sister; Prostate cancer (age of onset: 23) in her brother; Uterine cancer (age of onset: 90) in her daughter.  ROS:   Please see the history of present illness.    All other systems reviewed and are negative.  EKGs/Labs/Other Studies Reviewed:    The following studies were reviewed today: .Aaron AasEKG Interpretation Date/Time:  Thursday February 07 2024 11:34:35 EDT Ventricular Rate:  73 PR Interval:  138 QRS  Duration:  104 QT Interval:  412 QTC Calculation: 453 R Axis:   96  Text Interpretation: Normal sinus rhythm Rightward axis Nonspecific ST abnormality Abnormal ECG When compared with ECG of 16-Oct-2022 10:11, No significant change was found Confirmed by Hillis Lu [id number]) on 02/07/2024 1:28:32 PM     Recent Labs: No results found for requested labs within last 365 days.  Recent Lipid Panel    Component Value Date/Time   CHOL 185 04/20/2021 0943   TRIG 126 04/20/2021 0943   HDL 53 04/20/2021 0943   CHOLHDL 3.5 04/20/2021 0943   LDLCALC 110 (H) 04/20/2021 0943    Physical Exam:    VS:  BP 130/80   Pulse 73   Ht 5' 4 (1.626 m)   Wt 230 lb (104.3 kg)   SpO2 94%   BMI 39.48 kg/m     Wt Readings from Last 3 Encounters:  02/07/24 230 lb (104.3 kg)  11/15/22 235 lb 1.9 oz (106.6 kg)  10/16/22 235 lb (106.6 kg)     GEN: Patient is in no acute distress HEENT: Normal NECK: No JVD; No carotid bruits LYMPHATICS: No lymphadenopathy CARDIAC: Hear sounds  regular, 2/6 systolic murmur at the apex. RESPIRATORY:  Clear to auscultation without rales, wheezing or rhonchi  ABDOMEN: Soft, non-tender, non-distended MUSCULOSKELETAL:  No edema; No deformity  SKIN: Warm and dry NEUROLOGIC:  Alert and oriented x 3 PSYCHIATRIC:  Normal affect   Signed, Nelia Balzarine, MD  02/07/2024 2:46 PM    [facility]

## 2024-02-11 DIAGNOSIS — R7303 Prediabetes: Secondary | ICD-10-CM | POA: Diagnosis not present

## 2024-02-11 DIAGNOSIS — M810 Age-related osteoporosis without current pathological fracture: Secondary | ICD-10-CM | POA: Diagnosis not present

## 2024-02-11 DIAGNOSIS — Z139 Encounter for screening, unspecified: Secondary | ICD-10-CM | POA: Diagnosis not present

## 2024-02-11 DIAGNOSIS — Z23 Encounter for immunization: Secondary | ICD-10-CM | POA: Diagnosis not present

## 2024-02-11 DIAGNOSIS — J449 Chronic obstructive pulmonary disease, unspecified: Secondary | ICD-10-CM | POA: Diagnosis not present

## 2024-02-11 DIAGNOSIS — I2510 Atherosclerotic heart disease of native coronary artery without angina pectoris: Secondary | ICD-10-CM | POA: Diagnosis not present

## 2024-02-11 LAB — LAB REPORT - SCANNED
A1c: 6.1
EGFR: 76

## 2024-02-12 ENCOUNTER — Other Ambulatory Visit: Payer: Self-pay | Admitting: Cardiology

## 2024-03-07 ENCOUNTER — Telehealth: Payer: Self-pay | Admitting: Cardiology

## 2024-03-07 NOTE — Telephone Encounter (Signed)
 Calling about having a stress test done. Please adviser

## 2024-03-10 NOTE — Telephone Encounter (Signed)
 Left vm to return call.

## 2024-03-11 NOTE — Telephone Encounter (Signed)
 Left voice message to return call

## 2024-03-12 NOTE — Telephone Encounter (Signed)
 Called the patient regarding her request to schedule a stress test. She reported that she had stent's place in her heart in October 2020 and she has been having chest pain off and on since then. Her chest pain last for a couple of minutes each time and it occurs around 2 - 3 times per week in her left upper chest. She states that this is the same chest pain she had when she got her last stent in October 2020. She also reports that she is SOB and has had 2 dizzy spells over the past two weeks. Please advise.

## 2024-03-12 NOTE — Telephone Encounter (Signed)
 Recommendations reviewed with pt as per Dr. Posey note.  Pt verbalized understanding and had no additional questions. Appointment made for 7/16

## 2024-03-13 ENCOUNTER — Encounter: Payer: Self-pay | Admitting: Cardiology

## 2024-03-13 ENCOUNTER — Ambulatory Visit: Attending: Cardiology | Admitting: Cardiology

## 2024-03-13 VITALS — BP 142/78 | HR 90 | Ht 64.0 in | Wt 229.0 lb

## 2024-03-13 DIAGNOSIS — Z87891 Personal history of nicotine dependence: Secondary | ICD-10-CM

## 2024-03-13 DIAGNOSIS — I2510 Atherosclerotic heart disease of native coronary artery without angina pectoris: Secondary | ICD-10-CM

## 2024-03-13 DIAGNOSIS — E669 Obesity, unspecified: Secondary | ICD-10-CM

## 2024-03-13 DIAGNOSIS — E782 Mixed hyperlipidemia: Secondary | ICD-10-CM | POA: Diagnosis not present

## 2024-03-13 DIAGNOSIS — G4730 Sleep apnea, unspecified: Secondary | ICD-10-CM | POA: Diagnosis not present

## 2024-03-13 DIAGNOSIS — I10 Essential (primary) hypertension: Secondary | ICD-10-CM

## 2024-03-13 DIAGNOSIS — R079 Chest pain, unspecified: Secondary | ICD-10-CM | POA: Diagnosis not present

## 2024-03-13 MED ORDER — ISOSORBIDE MONONITRATE ER 30 MG PO TB24: 30.0000 mg | ORAL_TABLET | Freq: Every day | ORAL | 3 refills | Status: AC

## 2024-03-13 MED ORDER — ATORVASTATIN CALCIUM 20 MG PO TABS: 20.0000 mg | ORAL_TABLET | Freq: Every day | ORAL | 3 refills | Status: AC

## 2024-03-13 NOTE — Progress Notes (Signed)
 Cardiology Office Note:    Date:  03/13/2024   ID:  Amber Sexton, DOB 11-19-1955, MRN [id number]  PCP:  Care, [facility]  Cardiologist:  Jennifer JONELLE Crape, MD   Referring MD: Care, [facility]*    ASSESSMENT:    1. Coronary artery disease involving native coronary artery of native heart without angina pectoris   2. Chest pain, unspecified type   3. Essential hypertension   4. Sleep apnea, unspecified type   5. Ex-cigarette smoker   6. Mixed dyslipidemia   7. Obesity (BMI 35.0-39.9 without comorbidity)    PLAN:    In order of problems listed above:  Coronary artery disease: Chest pain: I discussed my findings with the patient at extensive length.  She has shortness of breath on exertion.  I suggested cardiac rehab but she is not keen on it because of logistics.  I have increased ordered added small dose Imdur  to her regimen and we will do a Lexiscan  sestamibi to assess her symptoms.  Further additions will be made based on the findings of the symptoms. Essential hypertension: Blood pressure stable and diet was emphasized.  Imdur  will help some of her blood pressure issues also. Mixed dyslipidemia: Lipids not at goal.  Her primary has told her to increase statin.  I agree with primary care.  Will double statin and she will back in 6 weeks for lipid lipid check. Morbid obesity: Weight reduction stressed diet emphasized and she promises to do better.  Risks of obesity reviewed. Patient will be seen in follow-up appointment in 6 months or earlier if the patient has any concerns.    Medication Adjustments/Labs and Tests Ordered: Current medicines are reviewed at length with the patient today.  Concerns regarding medicines are outlined above.  Orders Placed This Encounter  Procedures   Hepatic function panel   Lipid panel   MYOCARDIAL PERFUSION IMAGING   EKG 12-Lead   Meds ordered this encounter  Medications   atorvastatin  (LIPITOR ) 20 MG tablet    Sig:  Take 1 tablet (20 mg total) by mouth daily. Patient needs to make an appointment for further refills. 2nd attempt.    Dispense:  90 tablet    Refill:  3   isosorbide  mononitrate (IMDUR ) 30 MG 24 hr tablet    Sig: Take 1 tablet (30 mg total) by mouth daily.    Dispense:  90 tablet    Refill:  3     No chief complaint on file.    History of Present Illness:    Amber Sexton is a 68 y.o. female.  Patient has past medical history of coronary artery disease post stenting, essential hypertension, mixed dyslipidemia and dyspnea on exertion.  She is morbidly obese.  She leads a sedentary lifestyle.  She has had symptoms of chest tightness in the past.  She has undergone coronary angiography last year and the stents were found to be patent.  At the time of my evaluation, the patient is alert awake oriented and in no distress.  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  Report date is 11/15/2021.   The CancerNext-Expanded gene panel offered by [facility] and includes sequencing, rearrangement, and RNA analysis for the following 77 genes: AIP, ALK, APC, ATM, AXIN2, BAP1, BARD1, BLM, BMPR1A, BRCA1, BRCA2, BRIP1, CDC73, CDH1, CDK4, CDKN1B,    Meniere disease    Mixed dyslipidemia 03/02/2021   Morbid obesity (HCC) 11/03/2019   Obesity (BMI 35.0-39.9 without comorbidity) 07/20/2020   Osteoporosis  08/31/2021   Pulmonary hypertension (HCC)    Sleep apnea 04/28/2019    Past Surgical History:  Procedure Laterality Date   ABDOMINAL HYSTERECTOMY     CORONARY PRESSURE/FFR STUDY N/A 10/16/2022   Procedure: INTRAVASCULAR PRESSURE WIRE/FFR STUDY;  Surgeon: Wendel Lurena POUR, MD;  Location: [facility] INVASIVE CV LAB;  Service: Cardiovascular;  Laterality: N/A;   CORONARY STENT INTERVENTION N/A 06/09/2019   Procedure: CORONARY STENT INTERVENTION;  Surgeon: Claudene Victory ORN, MD;  Location: [facility]VAS[facility];  Service: Cardiovascular;  Laterality: N/A;   LEFT HEART CATH AND CORONARY ANGIOGRAPHY N/A 06/09/2019   Procedure: LEFT HEART CATH AND CORONARY ANGIOGRAPHY;  Surgeon: Claudene Victory ORN, MD;  Location: [facility] INVASIVE CV LAB;  Service: Cardiovascular;  Laterality: N/A;   LEFT HEART CATH AND CORONARY ANGIOGRAPHY N/A 10/16/2022   Procedure: LEFT HEART CATH AND CORONARY ANGIOGRAPHY;  Surgeon: Wendel Lurena POUR, MD;  Location: [facility] INVASIVE CV LAB;  Service: Cardiovascular;  Laterality: N/A;   TONSILLECTOMY      Current Medications: Current Meds  Medication Sig   albuterol  (PROAIR  HFA) 108 (90 Base) MCG/ACT inhaler Inhale 2 puffs into the lungs every 6 (six) hours as needed for wheezing or shortness of breath.   alendronate (FOSAMAX) 70 MG tablet Take 70 mg by mouth once a week.   aspirin  EC 81 MG tablet Take 81 mg by mouth daily.   Calcium  Carb-Cholecalciferol (CALCIUM  600 + D PO) Take 1 tablet by mouth daily.   isosorbide  mononitrate (IMDUR ) 30 MG 24 hr tablet Take 1 tablet (30 mg total) by mouth daily.   lansoprazole  (PREVACID ) 30 MG capsule Take 1 capsule (30 mg total) by mouth daily as needed (heartburn). Patient must keep appointment on 02/07/24 for further refills. 3 rd/final attempt   LUMIGAN 0.01 % SOLN Place 1 drop into both eyes daily.   nitroGLYCERIN  (NITROSTAT ) 0.4 MG SL tablet Place 1 tablet (0.4 mg total) under the tongue every 5 (five) minutes as needed for chest pain.   TRELEGY ELLIPTA 100-62.5-25  MCG/ACT AEPB Inhale 1 puff into the lungs daily.   [DISCONTINUED] atorvastatin  (LIPITOR ) 10 MG tablet Take 1 tablet (10 mg total) by mouth daily. Patient needs to make an appointment for further refills. 2nd attempt.     Allergies:   Ampicillin, Rosuvastatin , and Pitavastatin    Social History   Socioeconomic History   Marital status: Divorced    Spouse name: Not on file   Number of children: Not on file   Years of education: Not on file   Highest education level: Not on file  Occupational History   Not on file  Tobacco Use   Smoking status: Former    Current packs/day: 0.00    Types: Cigarettes    Quit date: 2014    Years since quitting: 11.5   Smokeless tobacco: Never  Vaping Use   Vaping status: Every Day  Substance and Sexual Activity   Alcohol  use: No   Drug use: No   Sexual activity: Not Currently  Other Topics Concern   Not on file  Social History Narrative   Not on file   Social Drivers of Health   Financial Resource Strain: Not on file  Food Insecurity: Not on file  Transportation Needs: Not on file  Physical Activity: Not on file  Stress: Not on file  Social Connections: Not on file     Family History: The patient's family history includes Breast cancer (age of onset: 31) in her sister; Colon cancer (age of onset: 56) in her daughter; Diabetes in her brother, brother, and sister; Hypertension in her brother, brother, and sister; Prostate cancer (age of onset: 48) in her brother; Uterine cancer (age of onset: 41) in her daughter.  ROS:   Please see the history of present illness.    All other systems reviewed and are negative.  EKGs/Labs/Other Studies Reviewed:    The following studies were reviewed today: .SABRAEKG Interpretation Date/Time:  Thursday March 13 2024 15:02:08 EDT Ventricular Rate:  90 PR Interval:  138 QRS Duration:  98 QT Interval:  358 QTC Calculation: 437 R Axis:   115  Text Interpretation: Normal sinus rhythm Incomplete right bundle  branch block Left posterior fascicular block Abnormal ECG When compared with ECG of 07-Feb-2024 11:34, No significant change was found Confirmed by Edwyna Backers [id number]) on 03/13/2024 3:30:18 PM     Recent Labs: No results found for requested labs within last 365 days.  Recent Lipid Panel    Component Value Date/Time   CHOL 185 04/20/2021 0943   TRIG 126 04/20/2021 0943   HDL 53 04/20/2021 0943   CHOLHDL 3.5 04/20/2021 0943   LDLCALC 110 (H) 04/20/2021 0943    Physical Exam:    VS:  BP (!) 142/78   Pulse 90   Ht 5' 4 (1.626 m)   Wt 229 lb (103.9 kg)   SpO2 94%   BMI 39.31 kg/m     Wt Readings from Last 3 Encounters:  03/13/24 229 lb (103.9 kg)  02/07/24 230 lb (104.3 kg)  11/15/22 235 lb 1.9 oz (106.6 kg)     GEN: Patient is in no acute distress HEENT: Normal NECK: No JVD; No carotid bruits LYMPHATICS: No lymphadenopathy CARDIAC: Hear sounds regular, 2/6 systolic murmur at the apex. RESPIRATORY:  Clear to auscultation without rales, wheezing or rhonchi  ABDOMEN: Soft, non-tender, non-distended MUSCULOSKELETAL:  No edema; No deformity  SKIN: Warm and dry NEUROLOGIC:  Alert and oriented x 3 PSYCHIATRIC:  Normal affect   Signed, Backers JONELLE Edwyna, MD  03/13/2024 3:42 PM    [facility]

## 2024-03-13 NOTE — Patient Instructions (Addendum)
 Medication Instructions:  Your physician has recommended you make the following change in your medication:   Increase your Atorvastatin  to 20 mg daily.  Start Imdur  30 mg daily.  *If you need a refill on your cardiac medications before your next appointment, please call your pharmacy*   Lab Work: Your physician recommends that you return for lab work in: 6 weeks for fasting lipids and lft's You need to have labs done when you are fasting.  You can come Monday through Friday 8:30 am to 12:00 pm and 1:15 to 4:30. You do not need to make an appointment as the order has already been placed.  If you have labs (blood work) drawn today and your tests are completely normal, you will receive your results only by: MyChart Message (if you have MyChart) OR A paper copy in the mail If you have any lab test that is abnormal or we need to change your treatment, we will call you to review the results.   Testing/Procedures: You are scheduled for a Myocardial Perfusion Imaging Study.  Please arrive 15 minutes prior to your appointment time for registration and insurance purposes.  The test will take approximately 3 to 4 hours to complete; you may bring reading material.  If someone comes with you to your appointment, they will need to remain in the main lobby due to limited space in the testing area.   How to prepare for your Myocardial Perfusion Test: Do not eat or drink 3 hours prior to your test, except you may have water. Do not consume products containing caffeine (regular or decaffeinated) 12 hours prior to your test. (ex: coffee, chocolate, sodas, tea). Do bring a list of your current medications with you.  If not listed below, you may take your medications as normal. Do wear comfortable clothes (no dresses or overalls) and walking shoes, tennis shoes preferred (No heels or open toe shoes are allowed). Do NOT wear cologne, perfume, aftershave, or lotions (deodorant is allowed). If these  instructions are not followed, your test will have to be rescheduled.  If you cannot keep your appointment, please provide 24 hours notification to the Nuclear Lab, to avoid a possible $50 charge to your account.  Follow-Up: At [facility], you and your health needs are our priority.  As part of our continuing mission to provide you with exceptional heart care, we have created designated Provider Care Teams.  These Care Teams include your primary Cardiologist (physician) and Advanced Practice Providers (APPs -  Physician Assistants and Nurse Practitioners) who all work together to provide you with the care you need, when you need it.  We recommend signing up for the patient portal called MyChart.  Sign up information is provided on this After Visit Summary.  MyChart is used to connect with patients for Virtual Visits (Telemedicine).  Patients are able to view lab/test results, encounter notes, upcoming appointments, etc.  Non-urgent messages can be sent to your provider as well.   To learn more about what you can do with MyChart, go to [email or web address].    Your next appointment:   6 month(s)  Provider:   Jennifer Crape, MD   Other Instructions  Cardiac Nuclear Scan A cardiac nuclear scan is a test that is done to check the flow of blood to your heart. It is done when you are resting and when you are exercising. The test looks for problems such as: Not enough blood reaching a portion of the heart. The heart muscle not  working as it should. You may need this test if you have: Heart disease. Lab results that are not normal. Had heart surgery or a balloon procedure to open up blocked arteries (angioplasty) or a small mesh tube (stent). Chest pain. Shortness of breath. Had a heart attack. In this test, a special dye (tracer) is put into your bloodstream. The tracer will travel to your heart. A camera will then take pictures of your heart to see how the tracer moves through  your heart. This test is usually done at a hospital and takes 2-4 hours. Tell a doctor about: Any allergies you have. All medicines you are taking, including vitamins, herbs, eye drops, creams, and over-the-counter medicines. Any bleeding problems you have. Any surgeries you have had. Any medical conditions you have. Whether you are pregnant or may be pregnant. Any history of asthma or long-term (chronic) lung disease. Any history of heart rhythm disorders or heart valve conditions. What are the risks? Your doctor will talk with you about risks. These may include: Serious chest pain and heart attack. This is only a risk if the stress portion of the test is done. Fast or uneven heartbeats (palpitations). A feeling of warmth in your chest. This feeling usually does not last long. Allergic reaction to the tracer. Shortness of breath or trouble breathing. What happens before the test? Ask your doctor about changing or stopping your normal medicines. Follow instructions from your doctor about what you cannot eat or drink. Remove your jewelry on the day of the test. Ask your doctor if you need to avoid nicotine or caffeine. What happens during the test? An IV tube will be inserted into one of your veins. Your doctor will give you a small amount of tracer through the IV tube. You will wait for 20-40 minutes while the tracer moves through your bloodstream. Your heart will be monitored with an electrocardiogram (ECG). You will lie down on an exam table. Pictures of your heart will be taken for about 15-20 minutes. You may also have a stress test. For this test, one of these things may be done: You will be asked to exercise on a treadmill or a stationary bike. You will be given medicines that will make your heart work harder. This is done if you are unable to exercise. When blood flow to your heart has peaked, a tracer will again be given through the IV tube. After 20-40 minutes, you will get  back on the exam table. More pictures will be taken of your heart. Depending on the tracer that is used, more pictures may need to be taken 3-4 hours later. Your IV tube will be removed when the test is over. The test may vary among doctors and hospitals. What happens after the test? Ask your doctor: Whether you can return to your normal schedule, including diet, activities, travel, and medicines. Whether you should drink more fluids. This will help to remove the tracer from your body. Ask your doctor, or the department that is doing the test: When will my results be ready? How will I get my results? What are my treatment options? What other tests do I need? What are my next steps? This information is not intended to replace advice given to you by your health care provider. Make sure you discuss any questions you have with your health care provider. Document Revised: 01/10/2022 Document Reviewed: 01/10/2022 Elsevier Patient Education  2023 [employer].

## 2024-03-17 ENCOUNTER — Ambulatory Visit (HOSPITAL_COMMUNITY)
Admission: RE | Admit: 2024-03-17 | Discharge: 2024-03-17 | Disposition: A | Discharge status: Home / Self Care | Source: Ambulatory Visit | Attending: Cardiology | Admitting: Cardiology

## 2024-03-17 ENCOUNTER — Other Ambulatory Visit: Payer: Self-pay | Admitting: Cardiology

## 2024-03-17 DIAGNOSIS — R079 Chest pain, unspecified: Secondary | ICD-10-CM | POA: Insufficient documentation

## 2024-03-17 LAB — MYOCARDIAL PERFUSION IMAGING
LV dias vol: 83 mL (ref 46–106)
LV sys vol: 24 mL (ref 3.8–5.2)
Nuc Stress EF: 71 %
Peak HR: 99 {beats}/min
Rest HR: 72 {beats}/min
Rest Nuclear Isotope Dose: 10.6 mCi
SDS: 0
SRS: 5
SSS: 0
ST Depression (mm): 0 mm
Stress Nuclear Isotope Dose: 30.3 mCi
TID: 1.15

## 2024-03-17 MED ORDER — REGADENOSON 0.4 MG/5ML IV SOLN
0.4000 mg | Freq: Once | INTRAVENOUS | Status: AC
  Administered 2024-03-17: 0.4 mg via INTRAVENOUS

## 2024-03-17 MED ORDER — TECHNETIUM TC 99M TETROFOSMIN IV KIT
30.3000 | PACK | Freq: Once | INTRAVENOUS | Status: AC | PRN
Start: 2024-03-17 — End: 2024-03-17
  Administered 2024-03-17: 30.3 via INTRAVENOUS

## 2024-03-17 MED ORDER — REGADENOSON 0.4 MG/5ML IV SOLN
INTRAVENOUS | Status: AC
  Filled 2024-03-17: qty 5

## 2024-03-17 MED ORDER — TECHNETIUM TC 99M TETROFOSMIN IV KIT
10.6000 | PACK | Freq: Once | INTRAVENOUS | Status: AC | PRN
  Administered 2024-03-17: 10.6 via INTRAVENOUS

## 2024-03-18 ENCOUNTER — Ambulatory Visit: Payer: Self-pay | Admitting: Cardiology

## 2024-03-21 ENCOUNTER — Other Ambulatory Visit: Payer: Self-pay

## 2024-03-21 DIAGNOSIS — I2510 Atherosclerotic heart disease of native coronary artery without angina pectoris: Secondary | ICD-10-CM

## 2024-03-21 DIAGNOSIS — E782 Mixed hyperlipidemia: Secondary | ICD-10-CM

## 2024-03-21 NOTE — Telephone Encounter (Signed)
 Patient is requesting to have a nurse go over the results of a recent test. Please advise.

## 2024-03-21 NOTE — Progress Notes (Signed)
 Patient informed of results. She states that she will come in early next week to have her lab work drawn.

## 2024-03-28 DIAGNOSIS — E782 Mixed hyperlipidemia: Secondary | ICD-10-CM | POA: Diagnosis not present

## 2024-03-28 DIAGNOSIS — I2510 Atherosclerotic heart disease of native coronary artery without angina pectoris: Secondary | ICD-10-CM | POA: Diagnosis not present

## 2024-03-29 LAB — LIPID PANEL
Chol/HDL Ratio: 2.5 ratio (ref 0.0–4.4)
Cholesterol, Total: 161 mg/dL (ref 100–199)
HDL: 65 mg/dL (ref >39)
LDL Chol Calc (NIH): 79 mg/dL (ref 0–99)
Triglycerides: 91 mg/dL (ref 0–149)
VLDL Cholesterol Cal: 17 mg/dL (ref 5–40)

## 2024-03-29 LAB — BASIC METABOLIC PANEL WITH GFR
BUN/Creatinine Ratio: 20 (ref 12–28)
BUN: 17 mg/dL (ref 8–27)
CO2: 23 mmol/L (ref 20–29)
Calcium: 9.8 mg/dL (ref 8.7–10.3)
Chloride: 100 mmol/L (ref 96–106)
Creatinine, Ser: 0.83 mg/dL (ref 0.57–1.00)
Glucose: 119 mg/dL — ABNORMAL HIGH (ref 70–99)
Potassium: 4.1 mmol/L (ref 3.5–5.2)
Sodium: 139 mmol/L (ref 134–144)
eGFR: 78 mL/min/1.73 (ref >59)

## 2024-03-29 LAB — HEPATIC FUNCTION PANEL
ALT: 13 IU/L (ref 0–32)
AST: 14 IU/L (ref 0–40)
Albumin: 4.2 g/dL (ref 3.9–4.9)
Alkaline Phosphatase: 99 IU/L (ref 44–121)
Bilirubin Total: 0.4 mg/dL (ref 0.0–1.2)
Bilirubin, Direct: 0.14 mg/dL (ref 0.00–0.40)
Total Protein: 6.6 g/dL (ref 6.0–8.5)

## 2024-03-30 ENCOUNTER — Ambulatory Visit: Payer: Self-pay | Admitting: Cardiology

## 2024-07-04 DIAGNOSIS — R051 Acute cough: Secondary | ICD-10-CM | POA: Diagnosis not present

## 2024-07-04 DIAGNOSIS — R300 Dysuria: Secondary | ICD-10-CM | POA: Diagnosis not present

## 2024-07-04 DIAGNOSIS — N398 Other specified disorders of urinary system: Secondary | ICD-10-CM | POA: Diagnosis not present

## 2024-07-04 DIAGNOSIS — R350 Frequency of micturition: Secondary | ICD-10-CM | POA: Diagnosis not present

## 2024-07-04 DIAGNOSIS — R0981 Nasal congestion: Secondary | ICD-10-CM | POA: Diagnosis not present

## 2024-07-04 DIAGNOSIS — R0982 Postnasal drip: Secondary | ICD-10-CM | POA: Diagnosis not present

## 2024-07-04 DIAGNOSIS — R3915 Urgency of urination: Secondary | ICD-10-CM | POA: Diagnosis not present

## 2024-07-23 DIAGNOSIS — Z23 Encounter for immunization: Secondary | ICD-10-CM | POA: Diagnosis not present

## 2024-07-23 DIAGNOSIS — N390 Urinary tract infection, site not specified: Secondary | ICD-10-CM | POA: Diagnosis not present

## 2024-09-11 ENCOUNTER — Ambulatory Visit: Admitting: Cardiology

## 2024-09-15 ENCOUNTER — Encounter: Payer: Self-pay | Admitting: Cardiology

## 2024-09-15 ENCOUNTER — Ambulatory Visit: Attending: Cardiology | Admitting: Cardiology

## 2024-09-15 VITALS — BP 138/80 | HR 80 | Ht 64.0 in | Wt 222.5 lb

## 2024-09-15 DIAGNOSIS — J431 Panlobular emphysema: Secondary | ICD-10-CM

## 2024-09-15 DIAGNOSIS — I2510 Atherosclerotic heart disease of native coronary artery without angina pectoris: Secondary | ICD-10-CM

## 2024-09-15 DIAGNOSIS — G4730 Sleep apnea, unspecified: Secondary | ICD-10-CM | POA: Diagnosis not present

## 2024-09-15 DIAGNOSIS — I10 Essential (primary) hypertension: Secondary | ICD-10-CM | POA: Diagnosis not present

## 2024-09-15 DIAGNOSIS — E782 Mixed hyperlipidemia: Secondary | ICD-10-CM | POA: Diagnosis not present

## 2024-09-15 NOTE — Patient Instructions (Signed)

## 2024-09-15 NOTE — Progress Notes (Signed)
 " Cardiology Office Note:    Date:  09/15/2024   ID:  Amber Sexton, DOB 1956/08/09, MRN [id number]  PCP:  [facility], [facility]  Cardiologist:  Jennifer JONELLE Crape, MD   Referring MD: [facility], [facility]    ASSESSMENT:    1. Coronary artery disease involving native coronary artery of native heart without angina pectoris   2. Essential hypertension   3. Panlobular emphysema (HCC)   4. Sleep apnea, unspecified type   5. Mixed dyslipidemia   6. Morbid obesity (HCC)    PLAN:    In order of problems listed above:  Coronary artery disease: Secondary prevention stressed with the patient.  Importance of compliance with diet medication stressed and patient verbalized standing.  Patient was advised to ambulate in walk at least half an hour a day on a daily basis and she promises to do so. Essential hypertension: Blood pressure is stable and diet was emphasized.  Salt intake issues discussed.  Lifestyle modification urged. Mixed dyslipidemia: On lipid-lowering medications followed by primary care.  She is going to get blood work done in the next few days and send us  a copy. Obesity: Weight reduction stressed diet emphasized and she promises to do better.  Risks of obesity explained. Sleep apnea: Sleep health issues were discussed. Patient will be seen in follow-up appointment in 6 months or earlier if the patient has any concerns.    Medication Adjustments/Labs and Tests Ordered: Current medicines are reviewed at length with the patient today.  Concerns regarding medicines are outlined above.  No orders of the defined types were placed in this encounter.  No orders of the defined types were placed in this encounter.    No chief complaint on file.    History of Present Illness:    Amber Sexton is a 69 y.o. female.  Patient has past medical history of coronary artery disease, essential hypertension, mixed dyslipidemia, sleep apnea and COPD.  She leads a  sedentary lifestyle.  She denies any chest pain orthopnea or PND.  She mentions to me that she checks her blood pressure at home and it is in the range of 140/70.  At the time of my evaluation, the patient is alert awake oriented and in no distress.  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  Report date is 11/15/2021.   The CancerNext-Expanded gene panel offered by [facility] and includes sequencing, rearrangement, and RNA analysis for the following 77 genes: AIP, ALK, APC, ATM, AXIN2, BAP1, BARD1, BLM, BMPR1A, BRCA1, BRCA2, BRIP1, CDC73, CDH1, CDK4, CDKN1B,    Meniere disease    Mixed dyslipidemia 03/02/2021   Morbid obesity (HCC) 11/03/2019   Obesity (BMI 35.0-39.9 without comorbidity) 07/20/2020   Osteoporosis 08/31/2021   Pulmonary hypertension (HCC)    Sleep apnea 04/28/2019    Past Surgical History:  Procedure Laterality Date   ABDOMINAL HYSTERECTOMY     CORONARY PRESSURE/FFR STUDY  N/A 10/16/2022   Procedure: INTRAVASCULAR PRESSURE WIRE/FFR STUDY;  Surgeon: Thukkani, Arun K, MD;  Location: [facility] INVASIVE [facility];  Service: Cardiovascular;  Laterality: N/A;   CORONARY STENT INTERVENTION N/A 06/09/2019   Procedure: CORONARY STENT INTERVENTION;  Surgeon: Claudene Victory ORN, MD;  Location: [facility] INVASIVE CV LAB;  Service: Cardiovascular;  Laterality: N/A;   LEFT HEART CATH AND CORONARY ANGIOGRAPHY N/A 06/09/2019    Procedure: LEFT HEART CATH AND CORONARY ANGIOGRAPHY;  Surgeon: Claudene Victory ORN, MD;  Location: [facility] INVASIVE CV LAB;  Service: Cardiovascular;  Laterality: N/A;   LEFT HEART CATH AND CORONARY ANGIOGRAPHY N/A 10/16/2022   Procedure: LEFT HEART CATH AND CORONARY ANGIOGRAPHY;  Surgeon: Wendel Lurena POUR, MD;  Location: [facility] INVASIVE CV LAB;  Service: Cardiovascular;  Laterality: N/A;   TONSILLECTOMY      Current Medications: Active Medications[1]   Allergies:   Ampicillin, Rosuvastatin , and Pitavastatin    Social History   Socioeconomic History   Marital status: Divorced    Spouse name: Not on file   Number of children: Not on file   Years of education: Not on file   Highest education level: Not on file  Occupational History   Not on file  Tobacco Use   Smoking status: Former    Current packs/day: 0.00    Average packs/day: 1.0 packs/day    Types: Cigarettes    Quit date: 2014    Years since quitting: 12.0   Smokeless tobacco: Never  Vaping Use   Vaping status: Every Day  Substance and Sexual Activity   Alcohol use: No   Drug use: No   Sexual activity: Not Currently  Other Topics Concern   Not on file  Social History Narrative   Not on file   Social Drivers of Health   Tobacco Use: Medium Risk (09/15/2024)   Patient History    Smoking Tobacco Use: Former    Smokeless Tobacco Use: Never    Passive Exposure: Not on [place]uary Strain: Not on file  Food Insecurity: Not on file  Transportation Needs: Not on file  Physical Activity: Not on file  Stress: Not on file  Social Connections: Not on file  Depression (EYV7-0): Not on file  Alcohol Screen: Not on file  Housing: Not on file  Utilities: Not on file  Health Literacy: Not on file     Family History: The patient's family history includes Breast cancer (age of onset: 39) in her sister; Colon cancer (age of onset: 51) in her daughter; Diabetes in her brother, brother, and sister; Hypertension in her  brother, brother, and sister; Prostate cancer (age of onset: 70) in her brother; Uterine cancer (age of onset: 18) in her daughter.  ROS:   Please see the history of present illness.    All other systems reviewed and are negative.  EKGs/Labs/Other Studies Reviewed:    The following studies were reviewed today: I discussed my findings with the patient at length   Recent Labs: 03/28/2024: ALT 13; BUN 17; Creatinine, Ser 0.83; Potassium 4.1; Sodium 139  Recent Lipid Panel    Component Value Date/Time   CHOL 161 03/28/2024 0958   TRIG 91 03/28/2024 0958   HDL 65 03/28/2024 0958   CHOLHDL 2.5 03/28/2024 0958   LDLCALC 79 03/28/2024 0958    Physical Exam:    VS:  BP (!) 162/80   Pulse 80   Ht 5' 4 (1.626 m)   Wt 222 lb 8 oz (100.9 kg)   SpO2 93%  BMI 38.19 kg/m     Wt Readings from Last 3 Encounters:  09/15/24 222 lb 8 oz (100.9 kg)  03/17/24 229 lb (103.9 kg)  03/13/24 229 lb (103.9 kg)     GEN: Patient is in no acute distress HEENT: Normal NECK: No JVD; No carotid bruits LYMPHATICS: No lymphadenopathy CARDIAC: Hear sounds regular, 2/6 systolic murmur at the apex. RESPIRATORY:  Clear to auscultation without rales, wheezing or rhonchi  ABDOMEN: Soft, non-tender, non-distended MUSCULOSKELETAL:  No edema; No deformity  SKIN: Warm and dry NEUROLOGIC:  Alert and oriented x 3 PSYCHIATRIC:  Normal affect   Signed, Jennifer JONELLE Crape, MD  09/15/2024 1:01 PM    [facility]     [1]  Current Meds  Medication Sig   albuterol  (PROAIR  HFA) 108 (90 Base) MCG/ACT inhaler Inhale 2 puffs into the lungs every 6 (six) hours as needed for wheezing or shortness of breath.   aspirin  EC 81 MG tablet Take 81 mg by mouth daily.   atorvastatin  (LIPITOR ) 20 MG tablet Take 1 tablet (20 mg total) by mouth daily. Patient needs to make an appointment for further refills. 2nd attempt.   Calcium  Carb-Cholecalciferol (CALCIUM  600 + D PO) Take 1 tablet by mouth daily.    isosorbide  mononitrate (IMDUR ) 30 MG 24 hr tablet Take 1 tablet (30 mg total) by mouth daily.   lansoprazole  (PREVACID ) 30 MG capsule Take 1 capsule (30 mg total) by mouth daily as needed (heartburn). Patient must keep appointment on 02/07/24 for further refills. 3 rd/final attempt   LUMIGAN 0.01 % SOLN Place 1 drop into both eyes daily.   nitroGLYCERIN  (NITROSTAT ) 0.4 MG SL tablet Place 1 tablet (0.4 mg total) under the tongue every 5 (five) minutes as needed for chest pain.   TRELEGY ELLIPTA 100-62.5-25 MCG/ACT AEPB Inhale 1 puff into the lungs daily.   "
